# Patient Record
Sex: Female | Born: 1990 | Race: White | Hispanic: No | Marital: Married | Smoking: Never smoker
Health system: Southern US, Community
[De-identification: ages and names within clinical notes are randomized; demographics above are authoritative.]

## PROBLEM LIST (undated history)

## (undated) ENCOUNTER — Inpatient Hospital Stay (HOSPITAL_COMMUNITY): Payer: Self-pay

## (undated) DIAGNOSIS — R87629 Unspecified abnormal cytological findings in specimens from vagina: Secondary | ICD-10-CM

## (undated) DIAGNOSIS — K219 Gastro-esophageal reflux disease without esophagitis: Secondary | ICD-10-CM

## (undated) DIAGNOSIS — R509 Fever, unspecified: Principal | ICD-10-CM

## (undated) DIAGNOSIS — A483 Toxic shock syndrome: Secondary | ICD-10-CM

## (undated) DIAGNOSIS — F419 Anxiety disorder, unspecified: Secondary | ICD-10-CM

## (undated) DIAGNOSIS — K589 Irritable bowel syndrome without diarrhea: Secondary | ICD-10-CM

## (undated) HISTORY — DX: Anxiety disorder, unspecified: F41.9

## (undated) HISTORY — PX: NO PAST SURGERIES: SHX2092

## (undated) HISTORY — DX: Fever, unspecified: R50.9

## (undated) HISTORY — DX: Irritable bowel syndrome without diarrhea: K58.9

## (undated) HISTORY — DX: Unspecified abnormal cytological findings in specimens from vagina: R87.629

## (undated) HISTORY — PX: WISDOM TOOTH EXTRACTION: SHX21

## (undated) HISTORY — DX: Toxic shock syndrome: A48.3

---

## 2013-02-08 ENCOUNTER — Inpatient Hospital Stay (HOSPITAL_COMMUNITY)
Admission: EM | Admit: 2013-02-08 | Discharge: 2013-02-10 | DRG: 423 | Disposition: A | Discharge status: Home / Self Care | Payer: BC Managed Care – PPO | Attending: Internal Medicine | Admitting: Internal Medicine

## 2013-02-08 ENCOUNTER — Emergency Department (HOSPITAL_COMMUNITY): Payer: BC Managed Care – PPO

## 2013-02-08 ENCOUNTER — Encounter (HOSPITAL_COMMUNITY): Payer: Self-pay | Admitting: *Deleted

## 2013-02-08 DIAGNOSIS — A483 Toxic shock syndrome: Principal | ICD-10-CM

## 2013-02-08 DIAGNOSIS — R000 Tachycardia, unspecified: Secondary | ICD-10-CM | POA: Diagnosis present

## 2013-02-08 DIAGNOSIS — Z6828 Body mass index (BMI) 28.0-28.9, adult: Secondary | ICD-10-CM

## 2013-02-08 DIAGNOSIS — IMO0002 Reserved for concepts with insufficient information to code with codable children: Secondary | ICD-10-CM | POA: Diagnosis present

## 2013-02-08 DIAGNOSIS — A419 Sepsis, unspecified organism: Secondary | ICD-10-CM

## 2013-02-08 DIAGNOSIS — T192XXA Foreign body in vulva and vagina, initial encounter: Secondary | ICD-10-CM | POA: Diagnosis present

## 2013-02-08 DIAGNOSIS — R6510 Systemic inflammatory response syndrome (SIRS) of non-infectious origin without acute organ dysfunction: Secondary | ICD-10-CM | POA: Insufficient documentation

## 2013-02-08 DIAGNOSIS — E669 Obesity, unspecified: Secondary | ICD-10-CM | POA: Diagnosis present

## 2013-02-08 LAB — URINALYSIS, ROUTINE W REFLEX MICROSCOPIC
Bilirubin Urine: NEGATIVE
Ketones, ur: NEGATIVE mg/dL
Nitrite: NEGATIVE
Protein, ur: NEGATIVE mg/dL
Urobilinogen, UA: 0.2 mg/dL (ref 0.0–1.0)

## 2013-02-08 LAB — COMPREHENSIVE METABOLIC PANEL
ALT: 13 U/L (ref 0–35)
AST: 15 U/L (ref 0–37)
Albumin: 4 g/dL (ref 3.5–5.2)
Alkaline Phosphatase: 105 U/L (ref 39–117)
BUN: 8 mg/dL (ref 6–23)
CO2: 28 mEq/L (ref 19–32)
Calcium: 9.5 mg/dL (ref 8.4–10.5)
Chloride: 100 mEq/L (ref 96–112)
Creatinine, Ser: 0.73 mg/dL (ref 0.50–1.10)
GFR calc Af Amer: >90 mL/min (ref >90)
GFR calc non Af Amer: >90 mL/min (ref >90)
Glucose, Bld: 88 mg/dL (ref 70–99)
Potassium: 3.7 mEq/L (ref 3.5–5.1)
Sodium: 137 mEq/L (ref 135–145)
Total Bilirubin: 0.5 mg/dL (ref 0.3–1.2)
Total Protein: 7.6 g/dL (ref 6.0–8.3)

## 2013-02-08 LAB — CBC WITH DIFFERENTIAL/PLATELET
Basophils Absolute: 0 10*3/uL (ref 0.0–0.1)
Basophils Relative: 0 % (ref 0–1)
Eosinophils Absolute: 0 10*3/uL (ref 0.0–0.7)
Eosinophils Relative: 0 % (ref 0–5)
HCT: 39.9 % (ref 36.0–46.0)
Hemoglobin: 13.6 g/dL (ref 12.0–15.0)
Lymphocytes Relative: 14 % (ref 12–46)
Lymphs Abs: 1.5 10*3/uL (ref 0.7–4.0)
MCH: 29 pg (ref 26.0–34.0)
MCHC: 34.1 g/dL (ref 30.0–36.0)
MCV: 85.1 fL (ref 78.0–100.0)
Monocytes Absolute: 0.9 10*3/uL (ref 0.1–1.0)
Monocytes Relative: 8 % (ref 3–12)
Neutro Abs: 8.4 10*3/uL — ABNORMAL HIGH (ref 1.7–7.7)
Neutrophils Relative %: 77 % (ref 43–77)
Platelets: 214 10*3/uL (ref 150–400)
RBC: 4.69 MIL/uL (ref 3.87–5.11)
RDW: 12.5 % (ref 11.5–15.5)
WBC: 10.8 10*3/uL — ABNORMAL HIGH (ref 4.0–10.5)

## 2013-02-08 LAB — APTT: aPTT: 31 seconds (ref 24–37)

## 2013-02-08 LAB — PROTIME-INR
INR: 1.1 (ref 0.00–1.49)
Prothrombin Time: 14.1 seconds (ref 11.6–15.2)

## 2013-02-08 MED ORDER — ONDANSETRON HCL 4 MG/2ML IJ SOLN
4.0000 mg | INTRAMUSCULAR | Status: DC | PRN
  Administered 2013-02-09 – 2013-02-10 (×2): 4 mg via INTRAVENOUS
  Filled 2013-02-08 (×2): qty 2

## 2013-02-08 MED ORDER — SODIUM CHLORIDE 0.9 % IV BOLUS (SEPSIS)
1000.0000 mL | Freq: Once | INTRAVENOUS | Status: AC
  Administered 2013-02-08: 1000 mL via INTRAVENOUS

## 2013-02-08 MED ORDER — ALUM & MAG HYDROXIDE-SIMETH 200-200-20 MG/5ML PO SUSP: 30.0000 mL | Freq: Four times a day (QID) | ORAL | Status: DC | PRN

## 2013-02-08 MED ORDER — ACETAMINOPHEN 325 MG PO TABS
650.0000 mg | ORAL_TABLET | ORAL | Status: DC | PRN
  Administered 2013-02-09: 650 mg via ORAL
  Filled 2013-02-08: qty 2

## 2013-02-08 MED ORDER — KETOROLAC TROMETHAMINE 30 MG/ML IJ SOLN
30.0000 mg | Freq: Once | INTRAMUSCULAR | Status: AC
  Administered 2013-02-08: 30 mg via INTRAVENOUS
  Filled 2013-02-08: qty 1

## 2013-02-08 MED ORDER — RISAQUAD PO CAPS
2.0000 | ORAL_CAPSULE | Freq: Every day | ORAL | Status: DC
  Administered 2013-02-09 – 2013-02-10 (×2): 2 via ORAL
  Filled 2013-02-08: qty 1
  Filled 2013-02-08 (×2): qty 2

## 2013-02-08 MED ORDER — ENOXAPARIN SODIUM 40 MG/0.4ML ~~LOC~~ SOLN
40.0000 mg | SUBCUTANEOUS | Status: DC
  Administered 2013-02-09: 40 mg via SUBCUTANEOUS
  Filled 2013-02-08: qty 0.4

## 2013-02-08 MED ORDER — HYDROMORPHONE HCL PF 1 MG/ML IJ SOLN: 0.5000 mg | INTRAMUSCULAR | Status: DC | PRN

## 2013-02-08 MED ORDER — SODIUM CHLORIDE 0.9 % IJ SOLN: 3.0000 mL | Freq: Two times a day (BID) | INTRAMUSCULAR | Status: DC

## 2013-02-08 MED ORDER — OXYCODONE HCL 5 MG PO TABS: 5.0000 mg | ORAL_TABLET | ORAL | Status: DC | PRN

## 2013-02-08 MED ORDER — VANCOMYCIN HCL IN DEXTROSE 1-5 GM/200ML-% IV SOLN
1000.0000 mg | Freq: Once | INTRAVENOUS | Status: AC
  Administered 2013-02-08: 1000 mg via INTRAVENOUS
  Filled 2013-02-08: qty 200

## 2013-02-08 MED ORDER — CLINDAMYCIN PHOSPHATE 600 MG/50ML IV SOLN
600.0000 mg | Freq: Once | INTRAVENOUS | Status: AC
  Administered 2013-02-09: 600 mg via INTRAVENOUS

## 2013-02-08 MED ORDER — HYDROXYZINE HCL 25 MG PO TABS
25.0000 mg | ORAL_TABLET | Freq: Every day | ORAL | Status: DC | PRN
Start: 2013-02-08 — End: 2013-02-10

## 2013-02-08 MED ORDER — BISACODYL 5 MG PO TBEC: 5.0000 mg | DELAYED_RELEASE_TABLET | Freq: Every day | ORAL | Status: DC | PRN

## 2013-02-08 MED ORDER — POTASSIUM CHLORIDE IN NACL 20-0.9 MEQ/L-% IV SOLN
INTRAVENOUS | Status: DC
  Administered 2013-02-09 – 2013-02-10 (×4): via INTRAVENOUS

## 2013-02-08 MED ORDER — ACETAMINOPHEN 325 MG PO TABS
650.0000 mg | ORAL_TABLET | Freq: Once | ORAL | Status: AC
  Administered 2013-02-08: 650 mg via ORAL
  Filled 2013-02-08: qty 2

## 2013-02-08 MED ORDER — TRAZODONE HCL 50 MG PO TABS: 25.0000 mg | ORAL_TABLET | Freq: Every evening | ORAL | Status: DC | PRN

## 2013-02-08 MED ORDER — CLINDAMYCIN PHOSPHATE 600 MG/50ML IV SOLN
INTRAVENOUS | Status: AC
  Filled 2013-02-08: qty 100

## 2013-02-08 MED ORDER — DIPHENHYDRAMINE HCL 50 MG/ML IJ SOLN
INTRAMUSCULAR | Status: AC
  Filled 2013-02-08: qty 1

## 2013-02-08 MED ORDER — CLINDAMYCIN PHOSPHATE 600 MG/50ML IV SOLN
600.0000 mg | Freq: Three times a day (TID) | INTRAVENOUS | Status: DC
  Administered 2013-02-09 – 2013-02-10 (×4): 600 mg via INTRAVENOUS
  Filled 2013-02-08 (×8): qty 50

## 2013-02-08 MED ORDER — DIPHENHYDRAMINE HCL 50 MG/ML IJ SOLN
25.0000 mg | Freq: Once | INTRAMUSCULAR | Status: AC
  Administered 2013-02-08: 25 mg via INTRAVENOUS

## 2013-02-08 NOTE — ED Provider Notes (Signed)
History     CSN: [id number]  Arrival date & time 02/08/13  1327   First MD Initiated Contact with Patient 02/08/13 1357      Chief Complaint  Patient presents with  . Fever    (Consider location/radiation/quality/duration/timing/severity/associated sxs/prior treatment) Patient is a 22 y.o. female presenting with fever. The history is provided by the patient. No language interpreter was used.  Fever Temp source:  Subjective Severity:  Moderate Onset quality:  Sudden Timing:  Constant Progression:  Waxing and waning Chronicity:  New Relieved by:  Nothing Ineffective treatments:  None tried Associated symptoms: diarrhea   Associated symptoms: no chest pain, no confusion, no congestion, no cough, no headaches and no rash     History reviewed. No pertinent past medical history.  History reviewed. No pertinent past surgical history.  History reviewed. No pertinent family history.  History  Substance Use Topics  . Smoking status: Never Smoker   . Smokeless tobacco: Not on file  . Alcohol Use: No    OB History   Grav Para Term Preterm Abortions TAB SAB Ect Mult Living                  Review of Systems  Constitutional: Positive for fever. Negative for appetite change and fatigue.  HENT: Negative for congestion, sinus pressure and ear discharge.   Eyes: Negative for discharge.  Respiratory: Negative for cough.   Cardiovascular: Negative for chest pain.  Gastrointestinal: Positive for diarrhea. Negative for abdominal pain.  Genitourinary: Negative for frequency and hematuria.  Musculoskeletal: Negative for back pain.  Skin: Negative for rash.  Neurological: Negative for seizures and headaches.  Psychiatric/Behavioral: Negative for hallucinations and confusion.    Allergies  Bactrim; Penicillins; and Zithromax  Home Medications   Current Outpatient Rx  Name  Route  Sig  Dispense  Refill  . hydrOXYzine (ATARAX/VISTARIL) 25 MG tablet   Oral   Take 25 mg by  mouth daily as needed (for panic attacks).         . Multiple Vitamins-Minerals (MULTIVITAMIN GUMMIES ADULT PO)   Oral   Take 1-2 each by mouth daily.           BP 120/79  Pulse 110  Temp(Src) 100.3 F (37.9 C) (Oral)  Resp 18  Ht 5\' 7"  (1.702 m)  Wt 167 lb (75.751 kg)  BMI 26.15 kg/m2  SpO2 100%  LMP 02/04/2013  Physical Exam  Constitutional: She is oriented to person, place, and time. She appears well-developed.  HENT:  Head: Normocephalic.  Eyes: Conjunctivae and EOM are normal. No scleral icterus.  Neck: Neck supple. No thyromegaly present.  Cardiovascular: Normal rate.  Exam reveals no gallop and no friction rub.   No murmur heard. tachycardia  Pulmonary/Chest: No stridor. She has no wheezes. She has no rales. She exhibits no tenderness.  Abdominal: She exhibits no distension. There is no tenderness. There is no rebound.  Genitourinary:  Nl pelvic  Musculoskeletal: Normal range of motion. She exhibits no edema.  Lymphadenopathy:    She has no cervical adenopathy.  Neurological: She is oriented to person, place, and time. Coordination normal.  Skin: No rash noted. No erythema.  Psychiatric: She has a normal mood and affect. Her behavior is normal.    ED Course  Procedures (including critical care time)  Labs Reviewed  URINALYSIS, ROUTINE W REFLEX MICROSCOPIC - Abnormal; Notable for the following:    Hgb urine dipstick SMALL (*)    All other components within normal limits  CBC WITH DIFFERENTIAL - Abnormal; Notable for the following:    WBC 10.8 (*)    Neutro Abs 8.4 (*)    All other components within normal limits  CULTURE, BLOOD (ROUTINE X 2)  CULTURE, BLOOD (ROUTINE X 2)  GC/CHLAMYDIA PROBE AMP  ANAEROBIC CULTURE  URINE MICROSCOPIC-ADD ON  COMPREHENSIVE METABOLIC PANEL  PROTIME-INR  APTT  POCT PREGNANCY, URINE   Dg Abd Acute W/chest  02/08/2013  *RADIOLOGY REPORT*  Clinical Data: Possible toxic shock syndrome  ACUTE ABDOMEN SERIES (ABDOMEN 2 VIEW  & CHEST 1 VIEW)  Comparison: None.  Findings:  Normal cardiac silhouette and mediastinal contours.  Lung volumes are slightly reduced with minimal perihilar linear opacities favored to represent atelectasis.  No focal airspace opacity.  No pleural effusion or pneumothorax.  Paucity of bowel gas without evidence of obstruction.  No pneumoperitoneum, pneumatosis or portal venous gas.  No abnormal intra-abdominal calcifications.  No acute osseous abnormalities.  IMPRESSION: 1.  No acute cardiopulmonary disease. 2.  Nonobstructive bowel gas pattern.   Original Report Authenticated By: Tacey Ruiz, MD      1. Toxic shock syndrome       MDM          Benny Lennert, MD 02/08/13 [phone]

## 2013-02-08 NOTE — ED Notes (Signed)
Fever 102, chills, headache, low back pain,  Sent by Dr Henreitta Leber in  [place]. Pt had left a tampon in for 16 hours yesterday. Nausea, diarrhea

## 2013-02-08 NOTE — ED Notes (Signed)
Dr Zammit at bedside. 

## 2013-02-08 NOTE — H&P (Signed)
[facility] History and Physical  Vanessa Stone  XBJ:[id number]  DOB: 1991-08-29   DOA: 02/08/2013   PCP:   Vanessa Stone, D.O.  Chief Complaint:  Fever and generalized aches today  HPI: Vanessa Stone is an 22 y.o. female.   Young Caucasian lady with no significant chronic medical problemS, will ice she left her tampon in for 16 hours and removed it at about 4 AM this morning. By 6 AM she started having watery yellow diarrhea fever and generalized body aches, and went to her primary care physician for evaluation. He found a white count of the elevated and recommended she go to the emergency room because of possible toxic shock syndrome.  She has been markedly tachycardic but her blood pressure has been well maintained. She has had about 18 watery yellow-green stool no blood. She denies  vomiting  In the emergency room patient was evaluated she was alert and oriented to normotensive, and the hospitalist service was requested for admission for intravenous antibiotics. ER physician reports she has no abnormal  vagina with discharge and no pelvic tenderness  Rewiew of Systems:   All systems negative except as marked bold or noted in the HPI;  Constitutional:    malaise, fever and chills. ; Generalized body aches Eyes:   eye pain, redness and discharge. ;  ENMT:   ear pain, hoarseness, nasal congestion, sinus pressure and sore throat. ;  Cardiovascular:    chest pain, palpitations, diaphoresis, dyspnea and peripheral edema.  Respiratory:   cough, hemoptysis, wheezing and stridor. ;  Gastrointestinal:  nausea, vomiting, diarrhea, constipation, abdominal pain, melena, blood in stool, hematemesis, jaundice and rectal bleeding. unusual weight loss..   Genitourinary:    frequency, dysuria, incontinence,flank pain and hematuria; Musculoskeletal:   back pain and neck pain.  swelling and trauma.;  Skin: .  pruritus, rash, abrasions, bruising and skin lesion.; ulcerations Neuro:     headache, lightheadedness and neck stiffness.  weakness, altered level of consciousness, altered mental status, extremity weakness, burning feet, involuntary movement, seizure and syncope.  Psych:    anxiety, depression, insomnia, tearfulness, panic attacks, hallucinations, paranoia, suicidal or homicidal ideation   Past Medical History  Diagnosis Date  . Medical history non-contributory     Past Surgical History  Procedure Laterality Date  . No past surgeries      Medications:  HOME MEDS: Prior to Admission medications   Medication Sig Start Date End Date Taking? Authorizing Provider  hydrOXYzine (ATARAX/VISTARIL) 25 MG tablet Take 25 mg by mouth daily as needed (for panic attacks).   Yes Historical Provider, MD  Multiple Vitamins-Minerals (MULTIVITAMIN GUMMIES ADULT PO) Take 1-2 each by mouth daily.   Yes Historical Provider, MD     Allergies:  Allergies  Allergen Reactions  . Bactrim (Sulfamethoxazole W-Trimethoprim) Diarrhea and Nausea And Vomiting  . Penicillins Hives and Swelling  . Vancomycin Other (See Comments)    Red man syndrome  . Zithromax (Azithromycin) Diarrhea and Nausea And Vomiting    Social History:   reports that she has never smoked. She does not have any smokeless tobacco history on file. She reports that she does not drink alcohol or use illicit drugs.  Family History: History reviewed. No pertinent family history. Her father had lung and colon cancer Her mother was diabetic had hypERthyroidism and became hypothyroid of the radioiodine ablation  Physical Exam: Filed Vitals:   02/08/13 1838 02/08/13 2001 02/08/13 2053 02/08/13 2148  BP: 120/79 122/74 118/74 116/70  Pulse: 110 113  112  Temp: 100.3 F (37.9 C) 99 F (37.2 C)  98.9 F (37.2 C)  TempSrc: Oral   Oral  Resp: 18 20  20   Height:    5\' 6"  (1.676 m)  Weight:    76.5 kg (168 lb 10.4 oz)  SpO2: 100% 96%  99%   Blood pressure 116/70, pulse 112, temperature 98.9 F (37.2 C),  temperature source Oral, resp. rate 20, height 5\' 6"  (1.676 m), weight 76.5 kg (168 lb 10.4 oz), last menstrual period 02/04/2013, SpO2 99.00%.  GEN:  Pleasant obese young Caucasian lady lying bed in no acute distress; cooperative with exam PSYCH:  alert and oriented x4;  neither anxious or depressed; affect is appropriate. HEENT: Mucous membranes pink dry and anicteric; PERRLA; EOM intact; no cervical lymphadenopathy nor thyromegaly or carotid bruit; no JVD; Breasts:: Not examined CHEST WALL: No tenderness CHEST: Normal respiration, clear to auscultation bilaterally HEART: Regular rate and rhythm; no murmurs rubs or gallops BACK: No kyphosis no scoliosis; no CVA tenderness ABDOMEN: Obese, soft non-tender; no masses, no organomegaly, normal abdominal bowel sounds; no pannus; no intertriginous candida. Rectal Exam: Not done EXTREMITIES: No bone or joint deformity;; no edema; no ulcerations. Genitalia: not examined PULSES: 2+ and symmetric SKIN: Normal hydration no rash or ulceration CNS: Cranial nerves 2-12 grossly intact no focal lateralizing neurologic deficit   Labs on Admission:  Basic Metabolic Panel:  Recent Labs Lab 02/08/13 1628  NA 137  K 3.7  CL 100  CO2 28  GLUCOSE 88  BUN 8  CREATININE 0.73  CALCIUM 9.5   Liver Function Tests:  Recent Labs Lab 02/08/13 1628  AST 15  ALT 13  ALKPHOS 105  BILITOT 0.5  PROT 7.6  ALBUMIN 4.0   No results found for this basename: LIPASE, AMYLASE,  in the last 168 hours No results found for this basename: AMMONIA,  in the last 168 hours CBC:  Recent Labs Lab 02/08/13 1628  WBC 10.8*  NEUTROABS 8.4*  HGB 13.6  HCT 39.9  MCV 85.1  PLT 214   Cardiac Enzymes: No results found for this basename: CKTOTAL, CKMB, CKMBINDEX, TROPONINI,  in the last 168 hours BNP: No components found with this basename: POCBNP,  D-dimer: No components found with this basename: D-DIMER,  CBG: No results found for this basename: GLUCAP,  in  the last 168 hours  Radiological Exams on Admission: Dg Abd Acute W/chest  02/08/2013  *RADIOLOGY REPORT*  Clinical Data: Possible toxic shock syndrome  ACUTE ABDOMEN SERIES (ABDOMEN 2 VIEW & CHEST 1 VIEW)  Comparison: None.  Findings:  Normal cardiac silhouette and mediastinal contours.  Lung volumes are slightly reduced with minimal perihilar linear opacities favored to represent atelectasis.  No focal airspace opacity.  No pleural effusion or pneumothorax.  Paucity of bowel gas without evidence of obstruction.  No pneumoperitoneum, pneumatosis or portal venous gas.  No abnormal intra-abdominal calcifications.  No acute osseous abnormalities.  IMPRESSION: 1.  No acute cardiopulmonary disease. 2.  Nonobstructive bowel gas pattern.   Original Report Authenticated By: Tacey Ruiz, MD      Assessment/Plan Present on Admission:  . SIRS (systemic inflammatory response syndrome) Possible early toxic shock syndrome  Were bring this lady in on observation for vigorous IV fluid hydration and intravenous clindamycin; she had a reaction to intravenous vancomycin. We'll give probiotics and clindamycin   CODE STATUS: Full code Family communication: Plans discussed with patient and her fianc at bedside Disposition: Observation admit to likely home tomorrow if STABLE  Deazia Lampi Nocturnist [facility] Pager [phone]   02/08/2013, 11:22 PM

## 2013-02-08 NOTE — ED Notes (Signed)
Pts face is still red and states her head feels a little tight. Pt given benadryl for reaction.

## 2013-02-08 NOTE — ED Notes (Signed)
Dr. Orvan Falconer advised of pts reaction.

## 2013-02-08 NOTE — ED Notes (Signed)
Pt states that he period has completely stopped today, and it is supposed to end tomorrow.

## 2013-02-08 NOTE — ED Notes (Signed)
Pt reports having green diarrhea and has started itching since vancomycin was started.

## 2013-02-09 DIAGNOSIS — A419 Sepsis, unspecified organism: Secondary | ICD-10-CM

## 2013-02-09 DIAGNOSIS — A483 Toxic shock syndrome: Secondary | ICD-10-CM

## 2013-02-09 LAB — CBC
HCT: 37.7 % (ref 36.0–46.0)
Hemoglobin: 12.6 g/dL (ref 12.0–15.0)
MCH: 29 pg (ref 26.0–34.0)
MCHC: 33.4 g/dL (ref 30.0–36.0)
MCV: 86.9 fL (ref 78.0–100.0)

## 2013-02-09 LAB — COMPREHENSIVE METABOLIC PANEL
BUN: 7 mg/dL (ref 6–23)
Calcium: 8.7 mg/dL (ref 8.4–10.5)
GFR calc Af Amer: >90 mL/min (ref >90)
Glucose, Bld: 103 mg/dL — ABNORMAL HIGH (ref 70–99)
Total Protein: 6.7 g/dL (ref 6.0–8.3)

## 2013-02-09 MED ORDER — LOPERAMIDE HCL 2 MG PO CAPS: 2.0000 mg | ORAL_CAPSULE | ORAL | Status: DC | PRN

## 2013-02-09 MED ORDER — LOPERAMIDE HCL 2 MG PO CAPS
4.0000 mg | ORAL_CAPSULE | Freq: Once | ORAL | Status: AC
  Administered 2013-02-09: 4 mg via ORAL
  Filled 2013-02-09: qty 2

## 2013-02-09 NOTE — Progress Notes (Signed)
UR Chart Review Completed  

## 2013-02-09 NOTE — Progress Notes (Signed)
[facility] PROGRESS NOTE  Nykeria Mealing ZOX:[id number] DOB: 09-03-1991 DOA: 02/08/2013 PCP: Anson [place], DO  Assessment/Plan: 1. Suspected toxic shock syndrome, early sepsis: Monitor fever curve. Continue clindamycin. Had "red man syndrome" with vancomycin. Follow blood cultures. Given clinical stability we will hold off on further vancomycin. Given normal pelvic exam description by EDP and full removal of tampon there is no reason to suspect retained contents. Monitor clinically  Code Status: Full code DVT prophylaxis: Will discontinue Lovenox. Early ambulation. Family Communication: Discussed with mother at bedside Disposition Plan: Anticipate discharge 4/7 home  Brendia Sacks, MD  [facility]  Pager [phone] If 7PM-7AM, please contact night-coverage at [email or web address], password [facility] 02/09/2013, 9:55 AM  LOS: 1 day   Brief narrative: 22 year old woman admitted for toxic shock syndrome.  Consultants:  None  Procedures:    Antibiotics:  Clindamycin 5/6  HPI/Subjective: Afebrile, vitals stable. Afebrile this a.m. 102.5. Overall she feels much better. No nausea or vomiting. Eating breakfast. No abdominal pain. Minimal vaginal discharge. EDP reported normal pelvic exam. Patient reports tampon came out completely.  Objective: Filed Vitals:   02/08/13 2001 02/08/13 2053 02/08/13 2148 02/09/13 0630  BP: 122/74 118/74 116/70 128/88  Pulse: 113  112 126  Temp: 99 F (37.2 C)  98.9 F (37.2 C) 102.5 F (39.2 C)  TempSrc:   Oral Oral  Resp: 20  20 20   Height:   5\' 6"  (1.676 m)   Weight:   76.5 kg (168 lb 10.4 oz) 78 kg (171 lb 15.3 oz)  SpO2: 96%  99% 98%    Intake/Output Summary (Last 24 hours) at 02/09/13 0955 Last data filed at 02/09/13 0600  Gross per 24 hour  Intake   3100 ml  Output    300 ml  Net   2800 ml   Filed Weights   02/08/13 1351 02/08/13 2148 02/09/13 0630  Weight: 75.751 kg (167 lb) 76.5 kg (168 lb 10.4 oz) 78 kg (171 lb 15.3 oz)     Exam:  General:  Appears calm and comfortable. Very well-appearing. Eating breakfast. Cardiovascular: RRR, no m/r/g. No LE edema. Respiratory: CTA bilaterally, no w/r/r. Normal respiratory effort. Abdomen: soft, ntnd Skin: no rash or induration seen Psychiatric: grossly normal mood and affect, speech fluent and appropriate  Data Reviewed: Basic Metabolic Panel:  Recent Labs Lab 02/08/13 1628 02/09/13 0504  NA 137 139  K 3.7 3.8  CL 100 105  CO2 28 26  GLUCOSE 88 103*  BUN 8 7  CREATININE 0.73 0.72  CALCIUM 9.5 8.7   Liver Function Tests:  Recent Labs Lab 02/08/13 1628 02/09/13 0504  AST 15 14  ALT 13 11  ALKPHOS 105 98  BILITOT 0.5 0.4  PROT 7.6 6.7  ALBUMIN 4.0 3.4*   CBC:  Recent Labs Lab 02/08/13 1628 02/09/13 0504  WBC 10.8* 9.1  NEUTROABS 8.4*  --   HGB 13.6 12.6  HCT 39.9 37.7  MCV 85.1 86.9  PLT 214 193     Studies: Dg Abd Acute W/chest  02/08/2013  *RADIOLOGY REPORT*  Clinical Data: Possible toxic shock syndrome  ACUTE ABDOMEN SERIES (ABDOMEN 2 VIEW & CHEST 1 VIEW)  Comparison: None.  Findings:  Normal cardiac silhouette and mediastinal contours.  Lung volumes are slightly reduced with minimal perihilar linear opacities favored to represent atelectasis.  No focal airspace opacity.  No pleural effusion or pneumothorax.  Paucity of bowel gas without evidence of obstruction.  No pneumoperitoneum, pneumatosis or portal venous gas.  No abnormal intra-abdominal  calcifications.  No acute osseous abnormalities.  IMPRESSION: 1.  No acute cardiopulmonary disease. 2.  Nonobstructive bowel gas pattern.   Original Report Authenticated By: Tacey Ruiz, MD     Scheduled Meds: . acidophilus  2 capsule Oral Daily  . clindamycin (CLEOCIN) IV  600 mg Intravenous Q8H  . enoxaparin (LOVENOX) injection  40 mg Subcutaneous Q24H  . sodium chloride  3 mL Intravenous Q12H   Continuous Infusions: . 0.9 % NaCl with KCl 20 mEq / L 150 mL/hr at 02/09/13 8756     Principal Problem:   TSS (toxic shock syndrome) Active Problems:   Sepsis     Brendia Sacks, MD  [facility] Pager [phone] If 7PM-7AM, please contact night-coverage at [email or web address], password [facility] 02/09/2013, 9:55 AM  LOS: 1 day   Time spent: 15 minutes

## 2013-02-09 NOTE — Progress Notes (Signed)
Pt c/o diarrhea today and states that she feels like she may be getting a yeast infection.  Pt's heart rate has been between 100-120 today.  Dr. Irene Limbo on unit and notified.  No new orders at this time.

## 2013-02-09 NOTE — Plan of Care (Signed)
Problem: Phase II Progression Outcomes Goal: Progress activity as tolerated unless otherwise ordered Outcome: Completed/Met Date Met:  02/09/13 Activity as tolerated.

## 2013-02-09 NOTE — Care Management Note (Unsigned)
    Page 1 of 1   02/09/2013     3:28:51 PM   CARE MANAGEMENT NOTE 02/09/2013  Patient:  Vanessa Stone, Vanessa Stone   Account Number:  [id number]  Date Initiated:  02/09/2013  Documentation initiated by:  Anibal Henderson  Subjective/Objective Assessment:   Admitted with possible sepsis, toxic shock syndrome. Patient is from home, is independent and will return home at discharge.     Action/Plan:   No CM needs identified   Anticipated DC Date:  02/11/2013   Anticipated DC Plan:  HOME/SELF CARE      DC Planning Services  CM consult      Choice offered to / List presented to:             Status of service:  In process, will continue to follow Medicare Important Message given?   (If response is "NO", the following Medicare IM given date fields will be blank) Date Medicare IM given:   Date Additional Medicare IM given:    Discharge Disposition:    Per UR Regulation:  Reviewed for med. necessity/level of care/duration of stay  If discussed at Long Length of Stay Meetings, dates discussed:    Comments:  02/09/13 1500 Anibal Henderson RN/CM

## 2013-02-09 NOTE — Plan of Care (Signed)
Problem: Phase I Progression Outcomes Goal: Initial discharge plan identified Outcome: Progressing Anticipated discharge 02/10/2013.

## 2013-02-09 NOTE — Plan of Care (Signed)
Problem: Phase II Progression Outcomes Goal: Discharge plan established Outcome: Progressing Anticipated discharge 02/10/2013.

## 2013-02-10 LAB — TROPONIN I: Troponin I: <0.3 ng/mL (ref <0.30)

## 2013-02-10 LAB — GC/CHLAMYDIA PROBE AMP: CT Probe RNA: NEGATIVE

## 2013-02-10 LAB — CK TOTAL AND CKMB (NOT AT ARMC)
Relative Index: INVALID (ref 0.0–2.5)
Total CK: 53 U/L (ref 7–177)

## 2013-02-10 MED ORDER — FLUCONAZOLE 150 MG PO TABS: 150.0000 mg | ORAL_TABLET | Freq: Once | ORAL | Status: DC

## 2013-02-10 MED ORDER — CLINDAMYCIN HCL 300 MG PO CAPS: 300.0000 mg | ORAL_CAPSULE | Freq: Three times a day (TID) | ORAL | Status: DC

## 2013-02-10 NOTE — Progress Notes (Signed)
Patient c/o chest pressure. EKG obtained- Normal Sinus Rhythm. No changes noted per [employer]. Vital signs WNL. MD. Paged and new orders given.

## 2013-02-10 NOTE — Discharge Instructions (Signed)
Toxic Shock Syndrome Toxic shock syndrome (TSS) is a disease that is caused by substances called toxins. The toxins that cause TSS may be made by three different types of bacteria. The toxins get into the bloodstream and damage many vital organs. Fever, rash, swelling of the hands and feet, shock, heart failure, kidney failure, liver failure, and lung damage have all been associated with TSS. TSS can also cause blood clotting and bleeding problems. TSS can be life-threatening. CAUSES  TSS occurs when one of three bacteria (Staphylococcus aureus, Streptococcus pyogenes, or Clostridium sordelli) enter the body through breaks or cracks in the skin or other tissue. The bacteria then produces toxins which enter the bloodstream and cause damage to many organs of the body. Staphylococcal TSS is caused by the bacteria Staphylococcus aureus. It can occur in menstruating women. Risk for this disease increases in women who use:  High absorbency tampons.  Diaphragms.  Sponges.  Cervical caps. Tampons can cause scrapes on the vaginal wall. This damage allows bacteria to enter into the vaginal wall. Super absorbent tampons are more harmful because they expand so much that they actually stick to the vaginal wall. When the tampon is removed, a layer of the vaginal lining may be scraped or peeled off. About half of all episodes of staphylococcal TSS occur in men, children, and menopausal women. In these cases, skin wounds or recent surgical cuts (incisions) are the site of entry for the bacteria. Streptococcal TSS is caused by the bacteria Streptococcus pyogenes. It may occur in healthy or ill children and adults of any age. The entry site of the bacteria into the body may not show obvious signs of infection. This disease can follow minor injuries such as blood clots under the skin, bruises, and muscle strain. It may also occur following chickenpox or a surgical procedure. People who have diabetes or alcoholism seem to  be at increased risk for this disease. Streptococcal TSS may also occur suddenly following childbirth. Clostridial TSS is caused by the bacteria Clostridium sordelli. This bacteria is naturally present in the vagina. The bacteria may enter a woman's tissue and cause this disease following childbirth, abortion, or a procedure or surgery on a woman's sexual organs. SYMPTOMS  Signs of an infection (warmth, redness, swelling, pain, or fluid drainage) where the bacteria entered the body may or may not be found. Symptoms of TSS may include:  High fever.  Vomiting.  Diarrhea.  Sunburn-like rash.  Low blood pressure.  Muscle aches and pains.  Headaches.  Sore throat.  Bloodshot eyes.  Confusion.  Swelling or peeling of the skin on the palms and soles of the feet.  Increase in heart rate. DIAGNOSIS  Your caregiver may perform some of the following tests to determine whether you have TSS:  Blood tests.  Blood, wound, and skin sample cultures.  Cultures from the vagina, mucus (sputum), and throat.  Chest X-rays.  Analysis of urine (urinalysis).  Lumbar puncture to check the spinal fluid. TREATMENT  Hospitalization is usually required. There is no way to predict which individuals with early TSS will develop severe medical problems. People who get proper treatment usually get better within 2 to 3 weeks. Treatment may include:  Immediate removal of the tampon, diaphragm, sponge, or cervical cap, if this applies.  Antibiotic medicines. Antibiotics may be given through an intravenous (IV) access.  IV fluids and medicines to raise low blood pressure and to replace fluids and body salts (sodium, potassium) lost from vomiting or diarrhea.  Dialysis if  there is severe kidney involvement. A kidney transplant may be necessary in rare cases.  Surgery to remove infected tissue. This is rare, except in some cases of streptococcal TSS. PREVENTION  Follow recommended tampon  guidelines.  Avoid super absorbent tampons.  Alternate the use of tampons with sanitary napkins or pads during your period. Use tampons during the day and pads at night.  Never leave a tampon inserted overnight.  Change tampons often, at least every 4 to 6 hours.  Do not leave in diaphragms, sponges, or cervical caps longer than instructed on the label.  Wash your hands with soap and warm water before inserting and removing a tampon, diaphragm, sponge, or cervical cap.  When inserting a tampon, be extremely careful not to scratch the vaginal lining. Plastic applicators may cause an increased risk because of the sharp edges produced when opening the applicator. If your vagina seems dry, use a water-soluble lubricating jelly. This will ease insertion.  Do not use a tampon between periods. It may dry out the vagina.  Use 100% cotton tampons instead of those made with synthetic materials.  Always choose the tampon with the least absorbency to meet your needs.  At the first sign of a fever or rash, remove the tampon immediately.  Do not leave your diaphragm or sponge in for longer than 12 hours.  Do not use tampons, a sponge, a cervical cap, or a diaphragm for 12 weeks after delivering a baby.  Do not use gauze packing for a nosebleed.  Wash cuts and scrapes with warm water and soap.  Keep scrapes and cuts covered to prevent infection from developing. HOME CARE INSTRUCTIONS  Take your antibiotics as directed. Finish them even if you start to feel better.  Women who had TSS should not use tampons ever again. SEEK IMMEDIATE MEDICAL CARE IF:   You have chills, vomiting, diarrhea, or a rash.  You have a convulsion or faint.  You have a cut or surgery and the skin around the wound becomes red, swollen, or drains pus.  You have a fever.  You have red streaks on the skin coming from an injury, piercing, or tattoo.  You just had a baby, within 12 weeks, and one of your children  has strep throat. Document Released: 12/14/2002 Document Revised: 12/16/2011 Document Reviewed: 05/23/2011 [facility] Patient Information 2013 [place], [place].

## 2013-02-10 NOTE — Plan of Care (Signed)
Problem: Discharge Progression Outcomes Goal: Other Discharge Outcomes/Goals Outcome: Completed/Met Date Met:  02/10/13 Discharged to home with mother Pt is without pain and nausea VS are stable

## 2013-02-10 NOTE — Progress Notes (Signed)
Pt. C/o nausea and given Zofran IV. No further c/o nausea and pt stated that pressure let up after Zofran was given.

## 2013-02-10 NOTE — Discharge Summary (Signed)
Physician Discharge Summary  Vanessa Stone ZOX:[id number] DOB: 1991/10/05 DOA: 02/08/2013  PCP: Anson Oregon, DO  Admit date: 02/08/2013 Discharge date: 02/10/2013  Time spent: 40 minutes  Recommendations for Outpatient Follow-up:  1. Followup primary care physician in one to 2 weeks  Discharge Diagnoses:  Principal Problem:   TSS (toxic shock syndrome) Active Problems:   Sepsis   Discharge Condition: Improved  Diet recommendation: Regular  Filed Weights   02/08/13 2148 02/09/13 0630 02/10/13 0534  Weight: 76.5 kg (168 lb 10.4 oz) 78 kg (171 lb 15.3 oz) 79.2 kg (174 lb 9.7 oz)    History of present illness:  Vanessa Stone is an 22 y.o. female. Young Caucasian lady with no significant chronic medical problemS, will ice she left her tampon in for 16 hours and removed it at about 4 AM this morning. By 6 AM she started having watery yellow diarrhea fever and generalized body aches, and went to her primary care physician for evaluation. He found a white count of the elevated and recommended she go to the emergency room because of possible toxic shock syndrome.  She has been markedly tachycardic but her blood pressure has been well maintained. She has had about 18 watery yellow-green stool no blood. She denies vomiting  In the emergency room patient was evaluated she was alert and oriented to normotensive, and the hospitalist service was requested for admission for intravenous antibiotics.  ER physician reports she has no abnormal vagina with discharge and no pelvic tenderness   Hospital Course:  This patient was admitted to the hospital with fever, generalized body aches, nausea vomiting diarrhea. She had left her tampon in for 16 hours and removed at 4 AM on the morning of admission. By 6 AM, she had developed watery diarrhea, fevers and generalized body aches. She got her primary physician who had noted an elevated white count and referred her to the emergency room for possible  toxic shock syndrome. She was noted to the hospital started on clindamycin. She reported an allergy to vancomycin. Clinically, she has improved. She is no longer febrile and WBC count has normalized. She's not having any further diarrhea or vomiting and is tolerating by mouth intake. She's not had any further fevers her heart rate has normalized. Antibiotics were changed to by mouth. She's been advised to no longer use tampons. She is otherwise stable for discharge.  Procedures:  None  Consultations:  None  Discharge Exam: Filed Vitals:   02/09/13 2123 02/10/13 0200 02/10/13 0534 02/10/13 1127  BP: 113/77 110/66 128/89 117/68  Pulse: 85 77 92 72  Temp: 98.8 F (37.1 C) 97.9 F (36.6 C) 97.8 F (36.6 C) 98.3 F (36.8 C)  TempSrc: Oral Oral Oral Oral  Resp: 20 20 20 20   Height:      Weight:   79.2 kg (174 lb 9.7 oz)   SpO2: 97% 98% 100% 100%    General: No acute distress Cardiovascular: S1, S2, regular rate and rhythm Respiratory: Clear to auscultation bilaterally  Discharge Instructions  Discharge Orders   Future Orders Complete By Expires     Call MD for:  extreme fatigue  As directed     Call MD for:  persistant dizziness or light-headedness  As directed     Call MD for:  persistant nausea and vomiting  As directed     Call MD for:  temperature >100.4  As directed     Diet general  As directed     Increase activity  slowly  As directed     Other Restrictions  As directed     Comments:      Do not use tampons until cleared by your family doctor        Medication List    TAKE these medications       clindamycin 300 MG capsule  Commonly known as:  CLEOCIN  Take 1 capsule (300 mg total) by mouth 3 (three) times daily.     fluconazole 150 MG tablet  Commonly known as:  DIFLUCAN  Take 1 tablet (150 mg total) by mouth once.     hydrOXYzine 25 MG tablet  Commonly known as:  ATARAX/VISTARIL  Take 25 mg by mouth daily as needed (for panic attacks).      MULTIVITAMIN GUMMIES ADULT PO  Take 1-2 each by mouth daily.       Allergies  Allergen Reactions  . Bactrim (Sulfamethoxazole W-Trimethoprim) Diarrhea and Nausea And Vomiting  . Penicillins Hives and Swelling  . Vancomycin Other (See Comments)    Red man syndrome  . Zithromax (Azithromycin) Diarrhea and Nausea And Vomiting       Follow-up Information   Follow up with Anson Oregon, DO. (1-2 weeks, call for appointment)    Contact information:   [address] [place] [address] [phone]        The results of significant diagnostics from this hospitalization (including imaging, microbiology, ancillary and laboratory) are listed below for reference.    Significant Diagnostic Studies: Dg Abd Acute W/chest  02/08/2013  *RADIOLOGY REPORT*  Clinical Data: Possible toxic shock syndrome  ACUTE ABDOMEN SERIES (ABDOMEN 2 VIEW & CHEST 1 VIEW)  Comparison: None.  Findings:  Normal cardiac silhouette and mediastinal contours.  Lung volumes are slightly reduced with minimal perihilar linear opacities favored to represent atelectasis.  No focal airspace opacity.  No pleural effusion or pneumothorax.  Paucity of bowel gas without evidence of obstruction.  No pneumoperitoneum, pneumatosis or portal venous gas.  No abnormal intra-abdominal calcifications.  No acute osseous abnormalities.  IMPRESSION: 1.  No acute cardiopulmonary disease. 2.  Nonobstructive bowel gas pattern.   Original Report Authenticated By: Tacey Ruiz, MD     Microbiology: Recent Results (from the past 240 hour(s))  CULTURE, BLOOD (ROUTINE X 2)     Status: None   Collection Time    02/08/13  4:28 PM      Result Value Range Status   Specimen Description BLOOD LEFT ANTECUBITAL   Final   Special Requests BOTTLES DRAWN AEROBIC ONLY 8CC   Final   Culture NO GROWTH 2 DAYS   Final   Report Status PENDING   Incomplete  ANAEROBIC CULTURE     Status: None   Collection Time    02/08/13  7:25 PM      Result Value  Range Status   Specimen Description OTHER   Final   Special Requests Normal   Final   Gram Stain     Final   Value: RARE WBC PRESENT, PREDOMINANTLY PMN     RARE SQUAMOUS EPITHELIAL CELLS PRESENT     FEW GRAM POSITIVE RODS   Culture     Final   Value: NO ANAEROBES ISOLATED; CULTURE IN PROGRESS FOR 5 DAYS   Report Status PENDING   Incomplete  CULTURE, ROUTINE-ABSCESS     Status: None   Collection Time    02/08/13  7:25 PM      Result Value Range Status   Specimen Description PELVIS  Final   Special Requests NONE   Final   Gram Stain     Final   Value: RARE WBC PRESENT, PREDOMINANTLY PMN     RARE SQUAMOUS EPITHELIAL CELLS PRESENT     FEW GRAM POSITIVE RODS   Culture NO GROWTH   Final   Report Status PENDING   Incomplete  GC/CHLAMYDIA PROBE AMP     Status: None   Collection Time    02/08/13  7:28 PM      Result Value Range Status   CT Probe RNA NEGATIVE  NEGATIVE Final   GC Probe RNA NEGATIVE  NEGATIVE Final   Comment: (NOTE)                                                                                              Normal Reference Range: Negative          Assay performed using the Gen-Probe APTIMA COMBO2 (R) Assay.     Acceptable specimen types for this assay include APTIMA Swabs (Unisex,     endocervical, urethral, or vaginal), first void urine, and ThinPrep     liquid based cytology samples.  CULTURE, BLOOD (ROUTINE X 2)     Status: None   Collection Time    02/08/13  7:31 PM      Result Value Range Status   Specimen Description BLOOD RIGHT ANTECUBITAL   Final   Special Requests BOTTLES DRAWN AEROBIC AND ANAEROBIC 10CC   Final   Culture NO GROWTH 2 DAYS   Final   Report Status PENDING   Incomplete     Labs: Basic Metabolic Panel:  Recent Labs Lab 02/08/13 1628 02/09/13 0504  NA 137 139  K 3.7 3.8  CL 100 105  CO2 28 26  GLUCOSE 88 103*  BUN 8 7  CREATININE 0.73 0.72  CALCIUM 9.5 8.7   Liver Function Tests:  Recent Labs Lab 02/08/13 1628  02/09/13 0504  AST 15 14  ALT 13 11  ALKPHOS 105 98  BILITOT 0.5 0.4  PROT 7.6 6.7  ALBUMIN 4.0 3.4*   No results found for this basename: LIPASE, AMYLASE,  in the last 168 hours No results found for this basename: AMMONIA,  in the last 168 hours CBC:  Recent Labs Lab 02/08/13 1628 02/09/13 0504  WBC 10.8* 9.1  NEUTROABS 8.4*  --   HGB 13.6 12.6  HCT 39.9 37.7  MCV 85.1 86.9  PLT 214 193   Cardiac Enzymes:  Recent Labs Lab 02/10/13 0550  CKTOTAL 53  CKMB 1.0  TROPONINI <0.30   BNP: BNP (last 3 results) No results found for this basename: PROBNP,  in the last 8760 hours CBG: No results found for this basename: GLUCAP,  in the last 168 hours     Signed:  Trigger Frasier  [facility] 02/10/2013, 12:36 PM

## 2013-02-13 LAB — CULTURE, BLOOD (ROUTINE X 2)
Culture: NO GROWTH
Culture: NO GROWTH

## 2013-02-13 LAB — CULTURE, ROUTINE-ABSCESS

## 2013-02-14 LAB — ANAEROBIC CULTURE

## 2014-05-26 LAB — OB RESULTS CONSOLE HIV ANTIBODY (ROUTINE TESTING): HIV: NONREACTIVE

## 2014-05-26 LAB — OB RESULTS CONSOLE GC/CHLAMYDIA
Chlamydia: NEGATIVE
GC PROBE AMP, GENITAL: NEGATIVE

## 2014-05-26 LAB — OB RESULTS CONSOLE ABO/RH: RH Type: POSITIVE

## 2014-05-26 LAB — OB RESULTS CONSOLE RUBELLA ANTIBODY, IGM: Rubella: IMMUNE

## 2014-05-26 LAB — OB RESULTS CONSOLE RPR: RPR: NONREACTIVE

## 2014-05-26 LAB — OB RESULTS CONSOLE HEPATITIS B SURFACE ANTIGEN: Hepatitis B Surface Ag: NEGATIVE

## 2014-05-26 LAB — OB RESULTS CONSOLE ANTIBODY SCREEN: ANTIBODY SCREEN: NEGATIVE

## 2014-08-26 ENCOUNTER — Encounter (HOSPITAL_COMMUNITY): Payer: Self-pay

## 2014-08-26 ENCOUNTER — Inpatient Hospital Stay (HOSPITAL_COMMUNITY)
Admission: AD | Admit: 2014-08-26 | Discharge: 2014-08-26 | Disposition: A | Discharge status: Home / Self Care | Payer: BC Managed Care – PPO | Source: Ambulatory Visit | Attending: Obstetrics and Gynecology | Admitting: Obstetrics and Gynecology

## 2014-08-26 DIAGNOSIS — R109 Unspecified abdominal pain: Secondary | ICD-10-CM | POA: Diagnosis not present

## 2014-08-26 DIAGNOSIS — O9989 Other specified diseases and conditions complicating pregnancy, childbirth and the puerperium: Secondary | ICD-10-CM

## 2014-08-26 DIAGNOSIS — Z3A21 21 weeks gestation of pregnancy: Secondary | ICD-10-CM | POA: Diagnosis not present

## 2014-08-26 DIAGNOSIS — O26892 Other specified pregnancy related conditions, second trimester: Secondary | ICD-10-CM | POA: Diagnosis not present

## 2014-08-26 DIAGNOSIS — O26899 Other specified pregnancy related conditions, unspecified trimester: Secondary | ICD-10-CM

## 2014-08-26 LAB — URINALYSIS, ROUTINE W REFLEX MICROSCOPIC
Bilirubin Urine: NEGATIVE
GLUCOSE, UA: NEGATIVE mg/dL
HGB URINE DIPSTICK: NEGATIVE
Ketones, ur: NEGATIVE mg/dL
Leukocytes, UA: NEGATIVE
Nitrite: NEGATIVE
Protein, ur: NEGATIVE mg/dL
SPECIFIC GRAVITY, URINE: 1.01 (ref 1.005–1.030)
Urobilinogen, UA: 0.2 mg/dL (ref 0.0–1.0)
pH: 6.5 (ref 5.0–8.0)

## 2014-08-26 NOTE — MAU Provider Note (Signed)
History     CSN: [id number]  Arrival date and time: 08/26/14 1544   None     Chief Complaint  Patient presents with  . Abdominal Pain   HPI Vanessa SartoriusBrittany Burnett is 23 y.o. G1P0 4751w4d weeks presenting with report of 4 episodes of abdominal pain at 1300.  Pain went away and returned on her way to the hospital.  She called the office and instructed to come here for evaluation.  She denies vaginal bleeding, abnormal discharge.  Normal BM X 2 today.  Had lunch at [employer].  She is a patient of Dr. Lilyan PuntMorris's.   Past Medical History  Diagnosis Date  . Medical history non-contributory     Past Surgical History  Procedure Laterality Date  . No past surgeries      No family history on file.  History  Substance Use Topics  . Smoking status: Never Smoker   . Smokeless tobacco: Not on file  . Alcohol Use: No    Allergies:  Allergies  Allergen Reactions  . Bactrim [Sulfamethoxazole-Trimethoprim] Diarrhea and Nausea And Vomiting  . Penicillins Hives and Swelling  . Vancomycin Other (See Comments)    Red man syndrome  . Zithromax [Azithromycin] Diarrhea and Nausea And Vomiting    Prescriptions prior to admission  Medication Sig Dispense Refill Last Dose  . clindamycin (CLEOCIN) 300 MG capsule Take 1 capsule (300 mg total) by mouth 3 (three) times daily. 21 capsule 0   . fluconazole (DIFLUCAN) 150 MG tablet Take 1 tablet (150 mg total) by mouth once. 1 tablet 0   . hydrOXYzine (ATARAX/VISTARIL) 25 MG tablet Take 25 mg by mouth daily as needed (for panic attacks).   Past Week at Unknown  . Multiple Vitamins-Minerals (MULTIVITAMIN GUMMIES ADULT PO) Take 1-2 each by mouth daily.   Past Week at Unknown    Review of Systems  Constitutional: Negative for fever and chills.  Gastrointestinal: Positive for abdominal pain. Negative for nausea, vomiting, diarrhea and constipation.  Genitourinary: Negative for dysuria, urgency, frequency and hematuria.       Neg vaginal bleeding, abnormal  discharge  Neurological: Negative for headaches.   Physical Exam   Blood pressure 127/76, pulse 105, temperature 97.9 F (36.6 C), temperature source Oral, resp. rate 16, height 5' 5.5" (1.664 m), weight 185 lb (83.915 kg), SpO2 100 %.  Physical Exam  Constitutional: She is oriented to person, place, and time. She appears well-developed and well-nourished. No distress.  HENT:  Head: Normocephalic.  Neck: Normal range of motion.  Cardiovascular: Normal rate.   Respiratory: Effort normal.  GI: Soft. She exhibits no distension and no mass. There is no tenderness. There is no rebound and no guarding.  Genitourinary: There is no rash, tenderness or lesion on the right labia. There is no rash, tenderness or lesion on the left labia. Uterus is enlarged (21 week size). Uterus is not tender. Cervix exhibits no motion tenderness, no discharge and no friability. No erythema, tenderness or bleeding in the vagina. No signs of injury around the vagina. No vaginal discharge found.  Cervix is closed.  Neurological: She is alert and oriented to person, place, and time.  Skin: Skin is warm and dry.  Psychiatric: She has a normal mood and affect. Her behavior is normal.   FETAL HEART RATE by doppler 146  MAU Course  Procedures  No contractions on TOCO  MDM Discussed patient MSE, findings on physical exam, FHR and TOCO findings.  Reassure and may discharge her to home.  Follow up as scheduled or call if sxs worsen.  Assessment and Plan  A:  Abdominal pain in second trimester pregnancy-resolved  P:  Encouraged more bland diet for the next few days      Call Dr. Renaldo FiddlerAdkins for increased sxs  KEY,EVE M 08/26/2014, 4:39 PM

## 2014-08-26 NOTE — MAU Note (Signed)
Patient states she had abdominal pain at about 1300 for about 30 minutes, 4 times. Stopped then on the way to the hospital she felt again. Denies bleeding, leaking, nausea, vomiting or diarrhea. Has felt fetal movement.

## 2014-08-26 NOTE — Discharge Instructions (Signed)

## 2014-10-07 NOTE — L&D Delivery Note (Signed)
Delivery Note  SVD viable female Apgars 7,8 over 2nd degree ML lac.  Placenta delivered spontaneously intact with 3VC. Repair with 2-0 Chromic with good support and hemostasis noted and R/V exam confirms.  PH art was sent.  [place] cord blood was not done.  Mother and baby were doing well.  EBL 300cc  Candice Campavid Aolanis Crispen, MD

## 2014-11-29 LAB — OB RESULTS CONSOLE GBS: STREP GROUP B AG: NEGATIVE

## 2014-12-21 ENCOUNTER — Encounter (HOSPITAL_COMMUNITY): Payer: Self-pay | Admitting: *Deleted

## 2014-12-21 ENCOUNTER — Inpatient Hospital Stay (HOSPITAL_COMMUNITY)
Admission: AD | Admit: 2014-12-21 | Discharge: 2014-12-22 | Disposition: A | Discharge status: Home / Self Care | Payer: BLUE CROSS/BLUE SHIELD | Source: Ambulatory Visit | Attending: Obstetrics and Gynecology | Admitting: Obstetrics and Gynecology

## 2014-12-21 DIAGNOSIS — R109 Unspecified abdominal pain: Secondary | ICD-10-CM

## 2014-12-21 DIAGNOSIS — Z3A38 38 weeks gestation of pregnancy: Secondary | ICD-10-CM | POA: Diagnosis not present

## 2014-12-21 DIAGNOSIS — R1011 Right upper quadrant pain: Secondary | ICD-10-CM | POA: Insufficient documentation

## 2014-12-21 DIAGNOSIS — O26899 Other specified pregnancy related conditions, unspecified trimester: Secondary | ICD-10-CM

## 2014-12-21 DIAGNOSIS — O9989 Other specified diseases and conditions complicating pregnancy, childbirth and the puerperium: Secondary | ICD-10-CM | POA: Diagnosis not present

## 2014-12-21 LAB — URINALYSIS, ROUTINE W REFLEX MICROSCOPIC
Bilirubin Urine: NEGATIVE
GLUCOSE, UA: NEGATIVE mg/dL
Hgb urine dipstick: NEGATIVE
KETONES UR: NEGATIVE mg/dL
LEUKOCYTES UA: NEGATIVE
Nitrite: NEGATIVE
PROTEIN: NEGATIVE mg/dL
Specific Gravity, Urine: 1.015 (ref 1.005–1.030)
UROBILINOGEN UA: 0.2 mg/dL (ref 0.0–1.0)
pH: 6 (ref 5.0–8.0)

## 2014-12-21 NOTE — MAU Note (Signed)
Pt reports she has had elevated B/P 's in the office and tonight about 2130 she started having right side pain and diarrhea. Was told to come in for right side pain.

## 2014-12-22 ENCOUNTER — Telehealth (HOSPITAL_COMMUNITY): Payer: Self-pay | Admitting: *Deleted

## 2014-12-22 ENCOUNTER — Encounter (HOSPITAL_COMMUNITY): Payer: Self-pay | Admitting: *Deleted

## 2014-12-22 DIAGNOSIS — R1011 Right upper quadrant pain: Secondary | ICD-10-CM | POA: Diagnosis not present

## 2014-12-22 LAB — COMPREHENSIVE METABOLIC PANEL
ALBUMIN: 3.2 g/dL — AB (ref 3.5–5.2)
ALK PHOS: 207 U/L — AB (ref 39–117)
ALT: 13 U/L (ref 0–35)
AST: 20 U/L (ref 0–37)
Anion gap: 9 (ref 5–15)
BILIRUBIN TOTAL: 0.4 mg/dL (ref 0.3–1.2)
BUN: 10 mg/dL (ref 6–23)
CO2: 20 mmol/L (ref 19–32)
Calcium: 8.9 mg/dL (ref 8.4–10.5)
Chloride: 106 mmol/L (ref 96–112)
Creatinine, Ser: 0.47 mg/dL — ABNORMAL LOW (ref 0.50–1.10)
GFR calc non Af Amer: >90 mL/min (ref >90)
Glucose, Bld: 101 mg/dL — ABNORMAL HIGH (ref 70–99)
POTASSIUM: 3.7 mmol/L (ref 3.5–5.1)
SODIUM: 135 mmol/L (ref 135–145)
Total Protein: 6.9 g/dL (ref 6.0–8.3)

## 2014-12-22 LAB — CBC
HEMATOCRIT: 38.6 % (ref 36.0–46.0)
HEMOGLOBIN: 12.9 g/dL (ref 12.0–15.0)
MCH: 28.8 pg (ref 26.0–34.0)
MCHC: 33.4 g/dL (ref 30.0–36.0)
MCV: 86.2 fL (ref 78.0–100.0)
PLATELETS: 171 10*3/uL (ref 150–400)
RBC: 4.48 MIL/uL (ref 3.87–5.11)
RDW: 13.8 % (ref 11.5–15.5)
WBC: 13.9 10*3/uL — AB (ref 4.0–10.5)

## 2014-12-22 LAB — PROTEIN / CREATININE RATIO, URINE
Creatinine, Urine: 90 mg/dL
Protein Creatinine Ratio: 0.1 (ref 0.00–0.15)
Total Protein, Urine: 9 mg/dL

## 2014-12-22 NOTE — Telephone Encounter (Signed)
Preadmission screen  

## 2014-12-22 NOTE — MAU Provider Note (Signed)
Chief Complaint:  No chief complaint on file.   First Provider Initiated Contact with Patient 12/22/14 0059     HPI: Vanessa Stone is a 24 y.o. G1P0 at [redacted]w[redacted]d who presents to maternity admissions reporting right upper quadrant pain and few episodes of diarrhea 30 minutes this evening. Had a few painful contractions around the time that she had diarrhea. No further contractions or diarrhea. States she was told her blood pressure was elevated in office. Called after hours number and told to come to maternity admissions for evaluation.   Denies headache, vision changes, leakage of fluid or vaginal bleeding. Good fetal movement.   Pregnancy Course: Uncomplicated  Past Medical History: Past Medical History  Diagnosis Date  . Medical history non-contributory     Past obstetric history: OB History  Gravida Para Term Preterm AB SAB TAB Ectopic Multiple Living  1             # Outcome Date GA Lbr Len/2nd Weight Sex Delivery Anes PTL Lv  1 Current               Past Surgical History: Past Surgical History  Procedure Laterality Date  . No past surgeries    . Wisdom tooth extraction       Family History: History reviewed. No pertinent family history.  Social History: History  Substance Use Topics  . Smoking status: Never Smoker   . Smokeless tobacco: Not on file  . Alcohol Use: No    Allergies:  Allergies  Allergen Reactions  . Bactrim [Sulfamethoxazole-Trimethoprim] Diarrhea and Nausea And Vomiting  . Penicillins Hives and Swelling  . Vancomycin Other (See Comments)    Red man syndrome  . Zithromax [Azithromycin] Diarrhea and Nausea And Vomiting    Meds:  Prescriptions prior to admission  Medication Sig Dispense Refill Last Dose  . Prenatal Vit-Fe Fumarate-FA (PRENATAL MULTIVITAMIN) TABS tablet Take 1 tablet by mouth daily at 12 noon.   12/21/2014 at Unknown time  . acetaminophen (TYLENOL) 325 MG tablet Take 325 mg by mouth every 6 (six) hours as needed for mild pain  or moderate pain.   Unknown at Unknown time    ROS:  Review of Systems  Constitutional: Negative for fever and chills.  Eyes: Negative for blurred vision.  Respiratory: Negative for shortness of breath.   Cardiovascular: Negative for leg swelling.  Gastrointestinal: Positive for abdominal pain and diarrhea. Negative for nausea, vomiting, constipation and blood in stool.  Genitourinary: Negative for dysuria, urgency, frequency, hematuria and flank pain.  Neurological: Negative for headaches.    Physical Exam  Blood pressure 134/78, pulse 97, temperature 98.2 F (36.8 C), temperature source Oral, resp. rate 18, height 5' 5.5" (1.664 m), weight 96.163 kg (212 lb).  Patient Vitals for the past 24 hrs:  BP Temp Temp src Pulse Resp Height Weight  12/22/14 0009 134/78 mmHg - - 97 - - -  12/21/14 2359 128/78 mmHg - - 100 - - -  12/21/14 2349 126/89 mmHg - - 100 - - -  12/21/14 2346 133/88 mmHg 98.2 F (36.8 C) Oral 97 18 5' 5.5" (1.664 m) 96.163 kg (212 lb)    GENERAL: Well-developed, well-nourished female in no acute distress.  HEENT: normocephalic HEART: normal rate RESP: normal effort ABDOMEN: Soft, non-tender, gravid appropriate for gestational age EXTREMITIES: Nontender, no edema NEURO: alert and oriented SPECULUM EXAM: Deferred    FHT:  Baseline 135 , moderate variability, accelerations present, no decelerations Contractions: Irregular, mild   Labs: Results for  orders placed or performed during the hospital encounter of 12/21/14 (from the past 24 hour(s))  Urinalysis, Routine w reflex microscopic     Status: None   Collection Time: 12/21/14 11:25 PM  Result Value Ref Range   Color, Urine YELLOW YELLOW   APPearance CLEAR CLEAR   Specific Gravity, Urine 1.015 1.005 - 1.030   pH 6.0 5.0 - 8.0   Glucose, UA NEGATIVE NEGATIVE mg/dL   Hgb urine dipstick NEGATIVE NEGATIVE   Bilirubin Urine NEGATIVE NEGATIVE   Ketones, ur NEGATIVE NEGATIVE mg/dL   Protein, ur NEGATIVE  NEGATIVE mg/dL   Urobilinogen, UA 0.2 0.0 - 1.0 mg/dL   Nitrite NEGATIVE NEGATIVE   Leukocytes, UA NEGATIVE NEGATIVE  Protein / creatinine ratio, urine     Status: None   Collection Time: 12/21/14 11:25 PM  Result Value Ref Range   Creatinine, Urine 90.00 mg/dL   Total Protein, Urine 9 mg/dL   Protein Creatinine Ratio 0.10 0.00 - 0.15  CBC     Status: Abnormal   Collection Time: 12/22/14 12:19 AM  Result Value Ref Range   WBC 13.9 (H) 4.0 - 10.5 K/uL   RBC 4.48 3.87 - 5.11 MIL/uL   Hemoglobin 12.9 12.0 - 15.0 g/dL   HCT 13.0 86.5 - 78.4 %   MCV 86.2 78.0 - 100.0 fL   MCH 28.8 26.0 - 34.0 pg   MCHC 33.4 30.0 - 36.0 g/dL   RDW 69.6 29.5 - 28.4 %   Platelets 171 150 - 400 K/uL  Comprehensive metabolic panel     Status: Abnormal   Collection Time: 12/22/14 12:19 AM  Result Value Ref Range   Sodium 135 135 - 145 mmol/L   Potassium 3.7 3.5 - 5.1 mmol/L   Chloride 106 96 - 112 mmol/L   CO2 20 19 - 32 mmol/L   Glucose, Bld 101 (H) 70 - 99 mg/dL   BUN 10 6 - 23 mg/dL   Creatinine, Ser 1.32 (L) 0.50 - 1.10 mg/dL   Calcium 8.9 8.4 - 44.0 mg/dL   Total Protein 6.9 6.0 - 8.3 g/dL   Albumin 3.2 (L) 3.5 - 5.2 g/dL   AST 20 0 - 37 U/L   ALT 13 0 - 35 U/L   Alkaline Phosphatase 207 (H) 39 - 117 U/L   Total Bilirubin 0.4 0.3 - 1.2 mg/dL   GFR calc non Af Amer >90 >90 mL/min   GFR calc Af Amer >90 >90 mL/min   Anion gap 9 5 - 15    Imaging:  No results found. MAU Course:  Assessment:   ICD-9-CM ICD-10-CM   1. RUQ abdominal pain 789.01 R10.11 Urinalysis, Routine w reflex microscopic     CBC     Comprehensive metabolic panel     Protein / creatinine ratio, urine  2. Abdominal pain affecting pregnancy, antepartum 646.83 O99.89 Urinalysis, Routine w reflex microscopic   789.00 R10.9 CBC     Comprehensive metabolic panel     Protein / creatinine ratio, urine    Plan: Discharge home in stable condition per consult with Dr. Marcelle Overlie. Preeclampsia precautions. Labor precautions and  fetal kick counts   Follow-up Information    Follow up with Meriel Pica, MD.   Specialty:  Obstetrics and Gynecology   Why:  As scheduled   Contact information:   [address] [place] [address] [phone]       Follow up with [facility].   Why:  As needed in emergencies   Contact information:   [address] [id number] [place] [id number] [phone]        Medication List    ASK your doctor about these medications        acetaminophen 325 MG tablet  Commonly known as:  TYLENOL  Take 325 mg by mouth every 6 (six) hours as needed for mild pain or moderate pain.     prenatal multivitamin Tabs tablet  Take 1 tablet by mouth daily at 12 noon.       [place] Bottari, CNM 12/22/2014 12:58 AM

## 2014-12-22 NOTE — Discharge Instructions (Signed)
Hypertension During Pregnancy °Hypertension, or high blood pressure, is when there is extra pressure inside your blood vessels that carry blood from the heart to the rest of your body (arteries). It can happen at any time in life, including pregnancy. Hypertension during pregnancy can cause problems for you and your baby. Your baby might not weigh as much as he or she should at birth or might be born early (premature). Very bad cases of hypertension during pregnancy can be life-threatening.  °Different types of hypertension can occur during pregnancy. These include: °· Chronic hypertension. This happens when a woman has hypertension before pregnancy and it continues during pregnancy. °· Gestational hypertension. This is when hypertension develops during pregnancy. °· Preeclampsia or toxemia of pregnancy. This is a very serious type of hypertension that develops only during pregnancy. It affects the whole body and can be very dangerous for both mother and baby.   °Gestational hypertension and preeclampsia usually go away after your baby is born. Your blood pressure will likely stabilize within 6 weeks. Women who have hypertension during pregnancy have a greater chance of developing hypertension later in life or with future pregnancies. °RISK FACTORS °There are certain factors that make it more likely for you to develop hypertension during pregnancy. These include: °· Having hypertension before pregnancy. °· Having hypertension during a previous pregnancy. °· Being overweight. °· Being older than 40 years. °· Being pregnant with more than one baby. °· Having diabetes or kidney problems. °SIGNS AND SYMPTOMS °Chronic and gestational hypertension rarely cause symptoms. Preeclampsia has symptoms, which may include: °· Increased protein in your urine. Your health care provider will check for this at every prenatal visit. °· Swelling of your hands and face. °· Rapid weight gain. °· Headaches. °· Visual changes. °· Being  bothered by light. °· Abdominal pain, especially in the upper right area. °· Chest pain. °· Shortness of breath. °· Increased reflexes. °· Seizures. These occur with a more severe form of preeclampsia, called eclampsia. °DIAGNOSIS  °You may be diagnosed with hypertension during a regular prenatal exam. At each prenatal visit, you may have: °· Your blood pressure checked. °· A urine test to check for protein in your urine. °The type of hypertension you are diagnosed with depends on when you developed it. It also depends on your specific blood pressure reading. °· Developing hypertension before 20 weeks of pregnancy is consistent with chronic hypertension. °· Developing hypertension after 20 weeks of pregnancy is consistent with gestational hypertension. °· Hypertension with increased urinary protein is diagnosed as preeclampsia. °· Blood pressure measurements that stay above 160 systolic or 110 diastolic are a sign of severe preeclampsia. °TREATMENT °Treatment for hypertension during pregnancy varies. Treatment depends on the type of hypertension and how serious it is. °· If you take medicine for chronic hypertension, you may need to switch medicines. °¨ Medicines called ACE inhibitors should not be taken during pregnancy. °¨ Low-dose aspirin may be suggested for women who have risk factors for preeclampsia. °· If you have gestational hypertension, you may need to take a blood pressure medicine that is safe during pregnancy. Your health care provider will recommend the correct medicine. °· If you have severe preeclampsia, you may need to be in the hospital. Health care providers will watch you and your baby very closely. You also may need to take medicine called magnesium sulfate to prevent seizures and lower blood pressure. °· Sometimes, an early delivery is needed. This may be the case if the condition worsens. It would be   done to protect you and your baby. The only cure for preeclampsia is delivery. °· Your health  care provider may recommend that you take one low-dose aspirin (81 mg) each day to help prevent high blood pressure during your pregnancy if you are at risk for preeclampsia. You may be at risk for preeclampsia if: °¨ You had preeclampsia or eclampsia during a previous pregnancy. °¨ Your baby did not grow as expected during a previous pregnancy. °¨ You experienced preterm birth with a previous pregnancy. °¨ You experienced a separation of the placenta from the uterus (placental abruption) during a previous pregnancy. °¨ You experienced the loss of your baby during a previous pregnancy. °¨ You are pregnant with more than one baby. °¨ You have other medical conditions, such as diabetes or an autoimmune disease. °HOME CARE INSTRUCTIONS °· Schedule and keep all of your regular prenatal care appointments. This is important. °· Take medicines only as directed by your health care provider. Tell your health care provider about all medicines you take. °· Eat as little salt as possible. °· Get regular exercise. °· Do not drink alcohol. °· Do not use tobacco products. °· Do not drink products with caffeine. °· Lie on your left side when resting. °SEEK IMMEDIATE MEDICAL CARE IF: °· You have severe abdominal pain. °· You have sudden swelling in your hands, ankles, or face. °· You gain 4 pounds (1.8 kg) or more in 1 week. °· You vomit repeatedly. °· You have vaginal bleeding. °· You do not feel your baby moving as much. °· You have a headache. °· You have blurred or double vision. °· You have muscle twitching or spasms. °· You have shortness of breath. °· You have blue fingernails or lips. °· You have blood in your urine. °MAKE SURE YOU: °· Understand these instructions. °· Will watch your condition. °· Will get help right away if you are not doing well or get worse. °Document Released: 06/11/2011 Document Revised: 02/07/2014 Document Reviewed: 04/22/2013 °Exit[employer]® Patient Information ©2015 [employer]. This information is not  intended to replace advice given to you by your health care provider. Make sure you discuss any questions you have with your health care provider. °Braxton Hicks Contractions °Contractions of the uterus can occur throughout pregnancy. Contractions are not always a sign that you are in labor.  °WHAT ARE BRAXTON HICKS CONTRACTIONS?  °Contractions that occur before labor are called Braxton Hicks contractions, or false labor. Toward the end of pregnancy (32-34 weeks), these contractions can develop more often and may become more forceful. This is not true labor because these contractions do not result in opening (dilatation) and thinning of the cervix. They are sometimes difficult to tell apart from true labor because these contractions can be forceful and people have different pain tolerances. You should not feel embarrassed if you go to the hospital with false labor. Sometimes, the only way to tell if you are in true labor is for your health care provider to look for changes in the cervix. °If there are no prenatal problems or other health problems associated with the pregnancy, it is completely safe to be sent home with false labor and await the onset of true labor. °HOW CAN YOU TELL THE DIFFERENCE BETWEEN TRUE AND FALSE LABOR? °False Labor °· The contractions of false labor are usually shorter and not as hard as those of true labor.   °· The contractions are usually irregular.   °· The contractions are often felt in the front of the lower   abdomen and in the groin.   °· The contractions may go away when you walk around or change positions while lying down.   °· The contractions get weaker and are shorter lasting as time goes on.   °· The contractions do not usually become progressively stronger, regular, and closer together as with true labor.   °True Labor °· Contractions in true labor last 30-70 seconds, become very regular, usually become more intense, and increase in frequency.   °· The contractions do not go  away with walking.   °· The discomfort is usually felt in the top of the uterus and spreads to the lower abdomen and low back.   °· True labor can be determined by your health care provider with an exam. This will show that the cervix is dilating and getting thinner.   °WHAT TO REMEMBER °· Keep up with your usual exercises and follow other instructions given by your health care provider.   °· Take medicines as directed by your health care provider.   °· Keep your regular prenatal appointments.   °· Eat and drink lightly if you think you are going into labor.   °· If Braxton Hicks contractions are making you uncomfortable:   °¨ Change your position from lying down or resting to walking, or from walking to resting.   °¨ Sit and rest in a tub of warm water.   °¨ Drink 2-3 glasses of water. Dehydration may cause these contractions.   °¨ Do slow and deep breathing several times an hour.   °WHEN SHOULD I SEEK IMMEDIATE MEDICAL CARE? °Seek immediate medical care if: °· Your contractions become stronger, more regular, and closer together.   °· You have fluid leaking or gushing from your vagina.   °· You have a fever.   °· You pass blood-tinged mucus.   °· You have vaginal bleeding.   °· You have continuous abdominal pain.   °· You have low back pain that you never had before.   °· You feel your baby's head pushing down and causing pelvic pressure.   °· Your baby is not moving as much as it used to.   °Document Released: 09/23/2005 Document Revised: 09/28/2013 Document Reviewed: 07/05/2013 °ExitCare® Patient Information ©2015 [employer]. This information is not intended to replace advice given to you by your health care provider. Make sure you discuss any questions you have with your health care provider. ° °

## 2014-12-26 ENCOUNTER — Inpatient Hospital Stay (HOSPITAL_COMMUNITY): Admission: RE | Admit: 2014-12-26 | Payer: BLUE CROSS/BLUE SHIELD | Source: Ambulatory Visit

## 2014-12-26 ENCOUNTER — Inpatient Hospital Stay (HOSPITAL_COMMUNITY)
Admission: AD | Admit: 2014-12-26 | Discharge: 2014-12-29 | DRG: 775 | Disposition: A | Discharge status: Home / Self Care | Payer: BLUE CROSS/BLUE SHIELD | Source: Ambulatory Visit | Attending: Obstetrics and Gynecology | Admitting: Obstetrics and Gynecology

## 2014-12-26 DIAGNOSIS — O133 Gestational [pregnancy-induced] hypertension without significant proteinuria, third trimester: Principal | ICD-10-CM | POA: Diagnosis present

## 2014-12-26 DIAGNOSIS — Z3A39 39 weeks gestation of pregnancy: Secondary | ICD-10-CM | POA: Diagnosis present

## 2014-12-26 NOTE — MAU Note (Signed)
Pt here for induction, unable to get to L/D room at this time.

## 2014-12-27 ENCOUNTER — Inpatient Hospital Stay (HOSPITAL_COMMUNITY): Payer: BLUE CROSS/BLUE SHIELD | Admitting: Anesthesiology

## 2014-12-27 ENCOUNTER — Encounter (HOSPITAL_COMMUNITY): Payer: Self-pay | Admitting: *Deleted

## 2014-12-27 DIAGNOSIS — O133 Gestational [pregnancy-induced] hypertension without significant proteinuria, third trimester: Secondary | ICD-10-CM | POA: Diagnosis present

## 2014-12-27 DIAGNOSIS — Z3A39 39 weeks gestation of pregnancy: Secondary | ICD-10-CM | POA: Diagnosis present

## 2014-12-27 DIAGNOSIS — Z3403 Encounter for supervision of normal first pregnancy, third trimester: Secondary | ICD-10-CM | POA: Diagnosis present

## 2014-12-27 LAB — COMPREHENSIVE METABOLIC PANEL
ALBUMIN: 3 g/dL — AB (ref 3.5–5.2)
ALK PHOS: 206 U/L — AB (ref 39–117)
ALT: 13 U/L (ref 0–35)
AST: 20 U/L (ref 0–37)
Anion gap: 10 (ref 5–15)
BILIRUBIN TOTAL: 0.4 mg/dL (ref 0.3–1.2)
BUN: 8 mg/dL (ref 6–23)
CHLORIDE: 103 mmol/L (ref 96–112)
CO2: 22 mmol/L (ref 19–32)
Calcium: 9.4 mg/dL (ref 8.4–10.5)
Creatinine, Ser: 0.54 mg/dL (ref 0.50–1.10)
GFR calc Af Amer: >90 mL/min (ref >90)
GFR calc non Af Amer: >90 mL/min (ref >90)
Glucose, Bld: 95 mg/dL (ref 70–99)
POTASSIUM: 4 mmol/L (ref 3.5–5.1)
Sodium: 135 mmol/L (ref 135–145)
TOTAL PROTEIN: 6.6 g/dL (ref 6.0–8.3)

## 2014-12-27 LAB — RPR: RPR Ser Ql: NONREACTIVE

## 2014-12-27 LAB — CBC
HCT: 38.1 % (ref 36.0–46.0)
HCT: 39.3 % (ref 36.0–46.0)
HEMOGLOBIN: 13.1 g/dL (ref 12.0–15.0)
Hemoglobin: 12.9 g/dL (ref 12.0–15.0)
MCH: 29.1 pg (ref 26.0–34.0)
MCH: 29 pg (ref 26.0–34.0)
MCHC: 33.9 g/dL (ref 30.0–36.0)
MCHC: 33.3 g/dL (ref 30.0–36.0)
MCV: 85.8 fL (ref 78.0–100.0)
MCV: 87.1 fL (ref 78.0–100.0)
Platelets: 178 10*3/uL (ref 150–400)
Platelets: 191 10*3/uL (ref 150–400)
RBC: 4.44 MIL/uL (ref 3.87–5.11)
RBC: 4.51 MIL/uL (ref 3.87–5.11)
RDW: 14 % (ref 11.5–15.5)
RDW: 14.1 % (ref 11.5–15.5)
WBC: 13.4 10*3/uL — ABNORMAL HIGH (ref 4.0–10.5)
WBC: 16.9 10*3/uL — ABNORMAL HIGH (ref 4.0–10.5)

## 2014-12-27 LAB — ABO/RH: ABO/RH(D): O POS

## 2014-12-27 LAB — TYPE AND SCREEN
ABO/RH(D): O POS
ANTIBODY SCREEN: NEGATIVE

## 2014-12-27 MED ORDER — FENTANYL 2.5 MCG/ML BUPIVACAINE 1/10 % EPIDURAL INFUSION (WH - ANES)
INTRAMUSCULAR | Status: DC | PRN
Start: 2014-12-27 — End: 2015-01-07
  Administered 2014-12-27: 14 mL/h via EPIDURAL

## 2014-12-27 MED ORDER — PHENYLEPHRINE 40 MCG/ML (10ML) SYRINGE FOR IV PUSH (FOR BLOOD PRESSURE SUPPORT)
80.0000 ug | PREFILLED_SYRINGE | INTRAVENOUS | Status: DC | PRN
  Filled 2014-12-27: qty 2
  Filled 2014-12-27: qty 20

## 2014-12-27 MED ORDER — DIPHENHYDRAMINE HCL 50 MG/ML IJ SOLN: 12.5000 mg | INTRAMUSCULAR | Status: DC | PRN

## 2014-12-27 MED ORDER — LIDOCAINE HCL (PF) 1 % IJ SOLN
INTRAMUSCULAR | Status: DC | PRN
  Administered 2014-12-27 (×2): 4 mL

## 2014-12-27 MED ORDER — MISOPROSTOL 25 MCG QUARTER TABLET
25.0000 ug | ORAL_TABLET | ORAL | Status: DC | PRN
  Administered 2014-12-27: 25 ug via VAGINAL
  Filled 2014-12-27: qty 1

## 2014-12-27 MED ORDER — ACETAMINOPHEN 325 MG PO TABS: 650.0000 mg | ORAL_TABLET | ORAL | Status: DC | PRN

## 2014-12-27 MED ORDER — EPHEDRINE 5 MG/ML INJ
10.0000 mg | INTRAVENOUS | Status: DC | PRN
  Filled 2014-12-27: qty 2

## 2014-12-27 MED ORDER — LACTATED RINGERS IV SOLN: 500.0000 mL | Freq: Once | INTRAVENOUS | Status: DC

## 2014-12-27 MED ORDER — FLEET ENEMA 7-19 GM/118ML RE ENEM
1.0000 | ENEMA | RECTAL | Status: DC | PRN
Start: 2014-12-27 — End: 2014-12-28

## 2014-12-27 MED ORDER — OXYTOCIN BOLUS FROM INFUSION
500.0000 mL | INTRAVENOUS | Status: DC
  Administered 2014-12-27: 500 mL via INTRAVENOUS

## 2014-12-27 MED ORDER — OXYTOCIN 40 UNITS IN LACTATED RINGERS INFUSION - SIMPLE MED
62.5000 mL/h | INTRAVENOUS | Status: DC
  Filled 2014-12-27: qty 1000

## 2014-12-27 MED ORDER — LIDOCAINE HCL (PF) 1 % IJ SOLN
30.0000 mL | INTRAMUSCULAR | Status: DC | PRN
  Filled 2014-12-27: qty 30

## 2014-12-27 MED ORDER — OXYCODONE-ACETAMINOPHEN 5-325 MG PO TABS: 2.0000 | ORAL_TABLET | ORAL | Status: DC | PRN

## 2014-12-27 MED ORDER — TERBUTALINE SULFATE 1 MG/ML IJ SOLN
0.2500 mg | Freq: Once | INTRAMUSCULAR | Status: AC | PRN
  Filled 2014-12-27: qty 1

## 2014-12-27 MED ORDER — PHENYLEPHRINE 40 MCG/ML (10ML) SYRINGE FOR IV PUSH (FOR BLOOD PRESSURE SUPPORT)
80.0000 ug | PREFILLED_SYRINGE | INTRAVENOUS | Status: DC | PRN
  Filled 2014-12-27: qty 2

## 2014-12-27 MED ORDER — ONDANSETRON HCL 4 MG/2ML IJ SOLN
4.0000 mg | Freq: Four times a day (QID) | INTRAMUSCULAR | Status: DC | PRN
Start: 2014-12-27 — End: 2014-12-28

## 2014-12-27 MED ORDER — FENTANYL 2.5 MCG/ML BUPIVACAINE 1/10 % EPIDURAL INFUSION (WH - ANES)
14.0000 mL/h | INTRAMUSCULAR | Status: DC | PRN
  Administered 2014-12-27 (×2): 14 mL/h via EPIDURAL
  Filled 2014-12-27 (×2): qty 125

## 2014-12-27 MED ORDER — LACTATED RINGERS IV SOLN
INTRAVENOUS | Status: DC
  Administered 2014-12-27 (×2): via INTRAVENOUS

## 2014-12-27 MED ORDER — OXYTOCIN 40 UNITS IN LACTATED RINGERS INFUSION - SIMPLE MED
1.0000 m[IU]/min | INTRAVENOUS | Status: DC
  Administered 2014-12-27: 2 m[IU]/min via INTRAVENOUS

## 2014-12-27 MED ORDER — LACTATED RINGERS IV SOLN: 500.0000 mL | INTRAVENOUS | Status: DC | PRN

## 2014-12-27 MED ORDER — OXYCODONE-ACETAMINOPHEN 5-325 MG PO TABS: 1.0000 | ORAL_TABLET | ORAL | Status: DC | PRN

## 2014-12-27 MED ORDER — CITRIC ACID-SODIUM CITRATE 334-500 MG/5ML PO SOLN
30.0000 mL | ORAL | Status: DC | PRN
Start: 2014-12-27 — End: 2014-12-28

## 2014-12-27 NOTE — Progress Notes (Signed)
Patient stating she feels like her vision is very blurry suddenly; BP within patient range and o2 sat 98%; no increased bleeding noted ; encouraged patient to eat a few bites of food at this time.

## 2014-12-27 NOTE — Anesthesia Procedure Notes (Signed)
Epidural Patient location during procedure: OB Start time: 12/27/2014 12:09 PM  Staffing Anesthesiologist: Mal AmabileFOSTER, Exzavier Ruderman Performed by: anesthesiologist   Preanesthetic Checklist Completed: patient identified, site marked, surgical consent, pre-op evaluation, timeout performed, IV checked, risks and benefits discussed and monitors and equipment checked  Epidural Patient position: sitting Prep: site prepped and draped and DuraPrep Patient monitoring: continuous pulse ox and blood pressure Approach: midline Location: L3-L4 Injection technique: LOR air  Needle:  Needle type: Tuohy  Needle gauge: 17 G Needle length: 9 cm and 9 Needle insertion depth: 6 cm Catheter type: closed end flexible Catheter size: 19 Gauge Catheter at skin depth: 11 cm Test dose: negative  Assessment Events: blood not aspirated, injection not painful, no injection resistance, negative IV test and no paresthesia  Additional Notes Patient identified. Risks and benefits discussed including failed block, incomplete  Pain control, post dural puncture headache, nerve damage, paralysis, blood pressure Changes, nausea, vomiting, reactions to medications-both toxic and allergic and post Partum back pain. All questions were answered. Patient expressed understanding and wished to proceed. Sterile technique was used throughout procedure. Epidural site was Dressed with sterile barrier dressing. No paresthesias, signs of intravascular injection Or signs of intrathecal spread were encountered.  Patient was more comfortable after the epidural was dosed. Please see RN's note for documentation of vital signs and FHR which are stable.

## 2014-12-27 NOTE — Anesthesia Preprocedure Evaluation (Signed)
Anesthesia Evaluation  Patient identified by MRN, date of birth, ID band Patient awake    Reviewed: Allergy & Precautions, Patient's Chart, lab work & pertinent test results  Airway Mallampati: III  TM Distance: >3 FB Neck ROM: Full    Dental no notable dental hx. (+) Teeth Intact   Pulmonary neg pulmonary ROS,  breath sounds clear to auscultation  Pulmonary exam normal       Cardiovascular hypertension, negative cardio ROS  Rhythm:Regular Rate:Normal     Neuro/Psych Anxiety negative neurological ROS     GI/Hepatic Neg liver ROS, GERD-  Medicated and Controlled,  Endo/Other  Obesity  Renal/GU negative Renal ROS  negative genitourinary   Musculoskeletal negative musculoskeletal ROS (+)   Abdominal (+) + obese,   Peds  Hematology negative hematology ROS (+)   Anesthesia Other Findings   Reproductive/Obstetrics (+) Pregnancy                             Anesthesia Physical Anesthesia Plan  ASA: II  Anesthesia Plan: Epidural   Post-op Pain Management:    Induction:   Airway Management Planned: Natural Airway  Additional Equipment:   Intra-op Plan:   Post-operative Plan:   Informed Consent: I have reviewed the patients History and Physical, chart, labs and discussed the procedure including the risks, benefits and alternatives for the proposed anesthesia with the patient or authorized representative who has indicated his/her understanding and acceptance.     Plan Discussed with: Anesthesiologist  Anesthesia Plan Comments:         Anesthesia Quick Evaluation
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
## 2014-12-27 NOTE — MAU Note (Signed)
Pt was scheduled for an induction, but waiting on bed in birthing suites

## 2014-12-28 LAB — CBC
HCT: 36.1 % (ref 36.0–46.0)
HEMOGLOBIN: 12.3 g/dL (ref 12.0–15.0)
MCH: 29.6 pg (ref 26.0–34.0)
MCHC: 34.1 g/dL (ref 30.0–36.0)
MCV: 86.8 fL (ref 78.0–100.0)
Platelets: 163 10*3/uL (ref 150–400)
RBC: 4.16 MIL/uL (ref 3.87–5.11)
RDW: 14 % (ref 11.5–15.5)
WBC: 18.1 10*3/uL — AB (ref 4.0–10.5)

## 2014-12-28 MED ORDER — TETANUS-DIPHTH-ACELL PERTUSSIS 5-2.5-18.5 LF-MCG/0.5 IM SUSP: 0.5000 mL | Freq: Once | INTRAMUSCULAR | Status: DC

## 2014-12-28 MED ORDER — DIBUCAINE 1 % RE OINT: 1.0000 "application " | TOPICAL_OINTMENT | RECTAL | Status: DC | PRN

## 2014-12-28 MED ORDER — LANOLIN HYDROUS EX OINT: TOPICAL_OINTMENT | CUTANEOUS | Status: DC | PRN

## 2014-12-28 MED ORDER — SENNOSIDES-DOCUSATE SODIUM 8.6-50 MG PO TABS
2.0000 | ORAL_TABLET | ORAL | Status: DC
Start: 2014-12-28 — End: 2014-12-29
  Administered 2014-12-28: 2 via ORAL
  Filled 2014-12-28: qty 2

## 2014-12-28 MED ORDER — OXYCODONE-ACETAMINOPHEN 5-325 MG PO TABS
2.0000 | ORAL_TABLET | ORAL | Status: DC | PRN
Start: 2014-12-28 — End: 2014-12-29

## 2014-12-28 MED ORDER — PRENATAL MULTIVITAMIN CH
1.0000 | ORAL_TABLET | Freq: Every day | ORAL | Status: DC
  Administered 2014-12-28 – 2014-12-29 (×2): 1 via ORAL
  Filled 2014-12-28 (×2): qty 1

## 2014-12-28 MED ORDER — WITCH HAZEL-GLYCERIN EX PADS
1.0000 "application " | MEDICATED_PAD | CUTANEOUS | Status: DC | PRN
  Administered 2014-12-28: 1 via TOPICAL

## 2014-12-28 MED ORDER — ONDANSETRON HCL 4 MG/2ML IJ SOLN: 4.0000 mg | INTRAMUSCULAR | Status: DC | PRN

## 2014-12-28 MED ORDER — IBUPROFEN 600 MG PO TABS
600.0000 mg | ORAL_TABLET | Freq: Four times a day (QID) | ORAL | Status: DC
  Administered 2014-12-28 – 2014-12-29 (×6): 600 mg via ORAL
  Filled 2014-12-28 (×6): qty 1

## 2014-12-28 MED ORDER — BENZOCAINE-MENTHOL 20-0.5 % EX AERO
1.0000 "application " | INHALATION_SPRAY | CUTANEOUS | Status: DC | PRN
  Administered 2014-12-28: 1 via TOPICAL
  Filled 2014-12-28: qty 56

## 2014-12-28 MED ORDER — ZOLPIDEM TARTRATE 5 MG PO TABS: 5.0000 mg | ORAL_TABLET | Freq: Every evening | ORAL | Status: DC | PRN

## 2014-12-28 MED ORDER — MEASLES, MUMPS & RUBELLA VAC ~~LOC~~ INJ
0.5000 mL | INJECTION | Freq: Once | SUBCUTANEOUS | Status: DC
  Filled 2014-12-28: qty 0.5

## 2014-12-28 MED ORDER — OXYCODONE-ACETAMINOPHEN 5-325 MG PO TABS: 1.0000 | ORAL_TABLET | ORAL | Status: DC | PRN

## 2014-12-28 MED ORDER — ONDANSETRON HCL 4 MG PO TABS: 4.0000 mg | ORAL_TABLET | ORAL | Status: DC | PRN

## 2014-12-28 MED ORDER — ACETAMINOPHEN 325 MG PO TABS
650.0000 mg | ORAL_TABLET | ORAL | Status: DC | PRN
  Administered 2014-12-28 (×2): 650 mg via ORAL
  Filled 2014-12-28 (×2): qty 2

## 2014-12-28 MED ORDER — DIPHENHYDRAMINE HCL 25 MG PO CAPS: 25.0000 mg | ORAL_CAPSULE | Freq: Four times a day (QID) | ORAL | Status: DC | PRN

## 2014-12-28 MED ORDER — MEDROXYPROGESTERONE ACETATE 150 MG/ML IM SUSP: 150.0000 mg | INTRAMUSCULAR | Status: DC | PRN

## 2014-12-28 MED ORDER — SIMETHICONE 80 MG PO CHEW: 80.0000 mg | CHEWABLE_TABLET | ORAL | Status: DC | PRN

## 2014-12-28 NOTE — Progress Notes (Signed)
Post Partum Day 1 Subjective: no complaints, up ad lib, voiding, tolerating PO and + flatus . Denies HA, blurred vision or RUQ pain. Reports elevated bp end of pregnancy. No meds required Objective: Blood pressure 134/88, pulse 92, temperature 98 F (36.7 C), temperature source Oral, resp. rate 16, height 5\' 6"  (1.676 m), weight 215 lb (97.523 kg), SpO2 99 %, unknown if currently breastfeeding.  Physical Exam:  General: alert and cooperative Lochia: appropriate Uterine Fundus: firm Incision: healing well DVT Evaluation: No evidence of DVT seen on physical exam. Negative Homan's sign. No cords or calf tenderness. No significant calf/ankle edema.   Recent Labs  12/27/14 1140 12/28/14 0745  HGB 13.1 12.3  HCT 39.3 36.1    Assessment/Plan: Plan for discharge tomorrow. Observe bp. Cmp on admission , normal LFT's   LOS: 1 day   Jasraj Lappe G 12/28/2014, 8:20 AM

## 2014-12-28 NOTE — Lactation Note (Signed)
This note was copied from the chart of Vanessa Stone. Lactation Consultation Note  Patient Name: Vanessa Stone Today's Date: 12/28/2014 Reason for consult: Initial assessment Baby 17 hours of life. Called to room by patient's nurse to assist with latching baby. Baby sleepy at breast. Enc mom to undress baby. Baby immediately more alert. Assisted mom to reposition herself and her pillows and demonstrated how to hand express and enc baby to open mouth wide. Mom has colostrum that is easily expressible. Baby latched deeply, suckling rhythmically with a few swallows noted. After nursing well for about 10 minutes, mom accidentally pressed her nipple out of baby's mouth. Mom's nipple is more everted after baby has suckled. Enc mom to compress breast nearer to chest and not near nipple. Enc mom to latch baby again herself, and baby latched deeply suckling rhythmically again. Enc mom to offer lots of STS, nurse with cues and at least 8-12 times/24 hours. [facility], aware of OP/BFSG, community resources, and [facility] phone line assistance after D/C. Enc mom to call for assistance with latching as needed. Discussed assessment and interventions with patient's RN, Dorene GrebeNatalie.  Maternal Data Has patient been taught Hand Expression?: Yes Does the patient have breastfeeding experience prior to this delivery?: No  Feeding Feeding Type: Breast Fed Length of feed:  (LC assessed first 15-20 minutes of BF.)  LATCH Score/Interventions Latch: Grasps breast easily, tongue down, lips flanged, rhythmical sucking. Intervention(s): Adjust position;Assist with latch;Breast compression  Audible Swallowing: A few with stimulation Intervention(s): Skin to skin;Hand expression Intervention(s): Skin to skin;Hand expression  Type of Nipple: Everted at rest and after stimulation  Comfort (Breast/Nipple): Soft / non-tender     Hold (Positioning): Assistance needed to correctly position infant at breast  and maintain latch. Intervention(s): Breastfeeding basics reviewed;Support Pillows;Position options;Skin to skin  LATCH Score: 8  Lactation Tools Discussed/Used Tools: Pump Breast pump type: Manual   Consult Status Consult Status: Follow-up Date: 12/29/14 Follow-up type: In-patient    Geralynn OchsWILLIARD, Jamesa Tedrick 12/28/2014, 3:02 PM

## 2014-12-28 NOTE — Progress Notes (Signed)
CSW acknowledges consult for history of anxiety.   CSW attempted to meet with MOB on two different occassions. First occasion, MOB had numerous visitors. On second attempt, MOB was in the shower.  CSW to complete assessment on 3/24.  

## 2014-12-29 MED ORDER — IBUPROFEN 600 MG PO TABS: 600.0000 mg | ORAL_TABLET | Freq: Four times a day (QID) | ORAL | Status: DC

## 2014-12-29 NOTE — Lactation Note (Signed)
This note was copied from the chart of Vanessa Stone. Lactation Consultation Note  Patient Name: Vanessa Stone Today's Date: 12/29/2014 Reason for consult: Follow-up assessment;Breast/nipple pain;Difficult latch Mom had baby latched on right breast when LC arrived using #24 nipple shield. Baby appears to be in a good suckling pattern but no audible swallows, few swallowing motions noted. No colostrum noted in nipple shield. Mom's right nipple is red, has blisters at the tip. Changed nipple shield back to #20, Mom tolerated the feeding well, baby appeared to have more depth with the 20 nipple shield, but still on scant amount of colostrum at Mom's nipple. Baby acting very hungry. Assisted Mom with latching baby to left breast. The left nipple has more red and clear blisters, tried #20 nipple shield for better fit but PS=7 and Mom could not tolerate. Changed back to size 24 but pain did not improve. With hand expression received few drops of colostrum from each breast. Had Mom pump with manual pump for 15 minutes on left breast but no colostrum received. With oral exam/suck exam LC noted thick labial frenulum and short lingual frenulum, baby will chew intermittently with suck exam. Plan given to Mom for d/c home: Due to left nipple soreness, Mom is going to not breastfeed on the left breast for the next 12-24 hours to let the nipple heal. Mom will BF on the right nipple using the #20 nipple shield. She will BF on demand but at least 8-12 times in 24 hours keeping baby actively nursing for 15-30 minutes. Advised to look for breast milk in the nipple shield with feedings. Post pump both breasts for 15 minutes to encourage milk production, prevent engorgement and protect milk supply. Mom is to supplement with EBM or formula starting with 10 ml today, increasing as needed per guidelines per hours of age.  Once Mom's left nipple feels better, she is to re-introduce left breast with feeding, use #20  nipple shield. BF as stated above but keep baby actively nursing for 15-20 minutes both breasts if possible. Post pump as described above. Continue to supplement until Mom is observing breast milk in the nipple shield and baby is satisfied at the breast.  Demonstrated to parents how to supplement using curved tipped syringe. FOB demonstrated [facility] with this feeding.  Monitor voids/stools.  Lactation OP f/u scheduled for Monday, 01/02/15 at 10:30 am.  Engorgement care reviewed if needed. Advised to refer to page 24 of Baby N Me Booklet, Page 25 for storage guidelines.   Maternal Data    Feeding Feeding Type: Formula Length of feed: 10 min  LATCH Score/Interventions Latch: Grasps breast easily, tongue down, lips flanged, rhythmical sucking. Intervention(s): Adjust position;Assist with latch;Breast massage  Audible Swallowing: A few with stimulation  Type of Nipple: Everted at rest and after stimulation  Comfort (Breast/Nipple): Engorged, cracked, bleeding, large blisters, severe discomfort Problem noted: Cracked, bleeding, blisters, bruises  Problem noted: Severe discomfort Interventions  (Cracked/bleeding/bruising/blister): Expressed breast milk to nipple Interventions (Mild/moderate discomfort): Comfort gels  Hold (Positioning): Assistance needed to correctly position infant at breast and maintain latch. Intervention(s): Breastfeeding basics reviewed;Support Pillows;Position options;Skin to skin  LATCH Score: 6  Lactation Tools Discussed/Used Tools: Shells;Nipple Shields;Pump;Comfort gels Nipple shield size: 20;24 Shell Type: Inverted Breast pump type: Manual   Consult Status Consult Status: Complete Date: 12/29/14 Follow-up type: In-patient    Vanessa Stone, Vanessa Stone 12/29/2014, 12:32 PM

## 2014-12-29 NOTE — Lactation Note (Signed)
This note was copied from the chart of Vanessa Stone. Lactation Consultation Note Mom stated she hasn't been able to latch baby, has no interest in BF earlier. Has been crying for sometime but can't get baby latched. Noted very short shaft nipples, Rt. Nipple compressible and pulls out well. Lt. Nipple w/edema and semi flat and compresses inwards to flat.  Fitted mom w/#16 NS, mom stated feels comfortable. Calmed baby and latched, baby crying and finally started suckling. Mom stated comfort. Baby started gagging at intervals, like was going to spit up. Removed from breast, patted, relatched, took several minutes d/t crying. This happened several times, last time noted baby spit up during BF, pulled off breast noticed blood. Dad suctioned baby, NS removed. Appeared to come from Nipple. Mom stated pain when compressed. Nipple oblong instead of round. #20 NS fitted nipple increased in size during suckling. No further bleeding from Rt. Nipple. Hand expressed colostrum and pat on nipple. No blood noted. Mom stated Rt. Nipple was stinging a little.  Fitted Lt. Side, #16NS fits well, but will try #20. Took a good while to latch baby d/t crying and fighting, yet rooting. Finally got baby to suckle. Mom states comfort during BF. Baby pulled off several times, no bleeding noted.  Shells given to mom to wear in bra in am. Comfort gels given to mom, stated felt great. Noted positional light stripe to Lt. Nipple prior to application of NS.  Explained deep latch verses shallow latch, demonstrated picture in Baby and Me book.  Room temp on 65 when first entered room. Explained to cold for baby. Up temp. To 69 degrees.  Patient Name: Vanessa Stone ZOXWR'UToday's Date: 12/29/2014 Reason for consult: Follow-up assessment;Breast/nipple pain;Difficult latch   Maternal Data    Feeding Feeding Type: Breast Fed Length of feed: 10 min (still BF)  LATCH Score/Interventions Latch: Repeated attempts needed to  sustain latch, nipple held in mouth throughout feeding, stimulation needed to elicit sucking reflex. Intervention(s): Adjust position;Assist with latch;Breast massage;Breast compression  Audible Swallowing: A few with stimulation Intervention(s): Skin to skin;Hand expression Intervention(s): Skin to skin;Hand expression;Alternate breast massage  Type of Nipple: Everted at rest and after stimulation (semi flat, short shaft, Lt. nipple compresses flat)  Comfort (Breast/Nipple): Filling, red/small blisters or bruises, mild/mod discomfort  Problem noted: Cracked, bleeding, blisters, bruises;Mild/Moderate discomfort Interventions  (Cracked/bleeding/bruising/blister): Expressed breast milk to nipple Interventions (Mild/moderate discomfort): Hand massage;Hand expression;Reverse pressue;Comfort gels;Breast shields  Hold (Positioning): Assistance needed to correctly position infant at breast and maintain latch. Intervention(s): Breastfeeding basics reviewed;Support Pillows;Position options;Skin to skin  LATCH Score: 6  Lactation Tools Discussed/Used Tools: Shells;Nipple Shields;Comfort gels Nipple shield size: 16;20 Shell Type: Inverted   Consult Status Consult Status: Follow-up Date: 12/29/14 (in pm) Follow-up type: In-patient    Aidan Moten, Diamond NickelLAURA G 12/29/2014, 1:09 AM

## 2014-12-29 NOTE — Progress Notes (Signed)
Clinical Social Work Department BRIEF PSYCHOSOCIAL ASSESSMENT 12/29/2014  Patient:  Vanessa Stone, Vanessa Stone     Account Number:  [id number]     Admit date:  12/26/2014  Clinical Social Worker:  Lucita Ferrara, CLINICAL SOCIAL WORKER  Date/Time:  12/29/2014 09:15 AM  Referred by:  RN  Date Referred:  12/27/2014  Other Referral:   MOB presents with history of anxiety.   Interview type:  Family Other interview type:    PSYCHOSOCIAL DATA Living Status:  FAMILY Admitted from facility:   Level of care:   Primary support name:  Helyn App Primary support relationship to patient:  PARTNER Degree of support available:   MOB and FOB endorsed strong family support.    CURRENT CONCERNS Current Concerns  None Noted   SOCIAL WORK ASSESSMENT / PLAN CSW met with MOB due to maternal history of anxiety.  MOB and FOB presented as easily engaged and receptive to the visit.  MOB displayed a full range in affect, presented in a pleasant mood, no symptoms of anxiety observed or noted in her thought process.  MOB and FOB expressed excitement as they transition to parenthood, eagerness for discharge, and were observed to be interacting/bonding with the infant.   MOB endorsed strong family support once she returns home, and reported only source of anxiety is difficulties with breastfeeding. She presented with insight on need to have patience since it takes time for both herself and the infant to learn how to breastfeed.  CSW validated and provided supportive listening as she discussed her feelings secondary to breastfeeding.  MOB reported onset of anxiety/panic attacks during college. She stated that she was prescribed medication, but only took it one time since she did not like how it made her feel.  MOB shared that she has since learned how to self-regulate with various coping skills such as deep breathing, utilizing the FOB as support, and problem solving.  She denied any acute anxiety during the pregnancy, and  stated that she only noted an increase in anxiety only prior to being induced.  CSW explored with MOB how various coping skills that she has previously learned may be helpful as she transitions to the postpartum period. CSW provided education on the baby blues and postpartum depression.  MOB denied questions or concerns about information, and agreed to reach out to her medical providers if she notes symptoms in the postpartum period. She stated that she has already noted that she has a hard time sleeping with the infant is sleeping out of fear that something will happen to her if she is sleeping.   Assessment/plan status:  No Further Intervention Required/No barriers to discharge.  Other assessment/ plan:   CSW provided education on the baby blues and postpartum depression.   Information/referral to community resources:   No referrals needed. MOB to follow up with OB or PCP if she notes onset of postpartum depression/anxiety.   PATIENT'S/FAMILY'S RESPONSE TO PLAN OF CARE: MOB and FOB expressed appreciation for the visit.  They denied questions, concerns, or needs.  MOB acknowledged increased risk for developoing postparutm depression given her history, but presents with insight on how to self-regulate.  She expressed confidence in her ability to self-regulate her anxiety, and agreed to contact her medical providers if she onset of postpartum mood disorders

## 2014-12-29 NOTE — Progress Notes (Signed)
Post Partum Day 2 Subjective: up ad lib, voiding, tolerating PO, + flatus and c/o nipple blisters  Objective: Blood pressure 134/82, pulse 83, temperature 98 F (36.7 C), temperature source Oral, resp. rate 20, height 5\' 6"  (1.676 m), weight 215 lb (97.523 kg), SpO2 99 %, unknown if currently breastfeeding.  Physical Exam:  General: alert and cooperative Lochia: appropriate Uterine Fundus: firm Incision: healing well DVT Evaluation: No evidence of DVT seen on physical exam. Negative Homan's sign. No cords or calf tenderness. No significant calf/ankle edema.   Recent Labs  12/27/14 1140 12/28/14 0745  HGB 13.1 12.3  HCT 39.3 36.1    Assessment/Plan: Discharge home  [facility] all purpose nipple cream   LOS: 2 days   CURTIS,CAROL G 12/29/2014, 8:18 AM

## 2014-12-29 NOTE — Discharge Summary (Signed)
Obstetric Discharge Summary Reason for Admission: induction of labor Prenatal Procedures: ultrasound Intrapartum Procedures: spontaneous vaginal delivery Postpartum Procedures: none Complications-Operative and Postpartum: 2 degree perineal laceration HEMOGLOBIN  Date Value Ref Range Status  12/28/2014 12.3 12.0 - 15.0 g/dL Final   HCT  Date Value Ref Range Status  12/28/2014 36.1 36.0 - 46.0 % Final    Physical Exam:  General: alert and cooperative Lochia: appropriate Uterine Fundus: firm Incision: healing well DVT Evaluation: No evidence of DVT seen on physical exam. Negative Homan's sign. No cords or calf tenderness. No significant calf/ankle edema.  Discharge Diagnoses: Term Pregnancy-delivered  Discharge Information: Date: 12/29/2014 Activity: pelvic rest Diet: routine Medications: PNV and Ibuprofen Condition: stable Instructions: refer to practice specific booklet Discharge to: home   Newborn Data: Live born female  Birth Weight: 7 lb 6.5 oz (3359 g) APGAR: 7, 8  Home with mother.  CURTIS,CAROL G 12/29/2014, 8:21 AM

## 2015-01-02 ENCOUNTER — Ambulatory Visit (HOSPITAL_COMMUNITY)
Admit: 2015-01-02 | Discharge: 2015-01-02 | Disposition: A | Discharge status: Home / Self Care | Payer: BLUE CROSS/BLUE SHIELD | Attending: Obstetrics and Gynecology | Admitting: Obstetrics and Gynecology

## 2015-01-03 NOTE — Lactation Note (Signed)
Lactation Consult  Mother's reason for visit: difficult latch, painful, evaluate milk transfer  Consult:  Initial Lactation Consultant:  Omar Personaly, Charrie Mcconnon M  ________________________________________________________________________  Joan FloresBaby's Name: Vanessa Stone Date of Birth: 12/27/2014 Pediatrician: Ermalinda BarriosMark Brassfield, MD Gender: female Gestational Age: 441w1d (At Birth) Birth Weight: 7 lb 6.5 oz (3359 g) Weight at Discharge: Weight: 7 lb 0.5 oz (3190 g)[facility]   12/[facility]14 2106 12/29/14 0033  Weight: 7 lb 6.5 oz (3359 g) 7 lb 0.5 oz (3190 g)   [facility] 15 [facility] Location:Pediatrician's office on 12/31/2014 Weight today: 7 lbs 0.3 oz       ________________________________________________________________________  Mother's Name: Vanessa SartoriusBrittany Stone   Breastfeeding Experience: first baby Maternal Medical Conditions:  Stable post delivery Maternal Medications:  PNV q day  ________________________________________________________________________  Breastfeeding History (Post Discharge)  Frequency of breastfeeding:  Breastfeeding q 2-4 hours Duration of feeding:  20-45 min  Supplementation  Breastmilk:  Volume 45-60 ml Frequency:  Every other feeding, dad feeds baby expressed milk per bottle Total volume per day:   150-22240ml  Method:  Bottle, mother stopped SNS after d/c from the hospital  Infant Intake and Output Assessment  Voids:  7 in 24 hrs.  Color:  Clear yellow Stools:  5 in 24 hrs.  Color:  Yellow  ________________________________________________________________________  Maternal Breast Assessment  Breast:  Full Nipple:  Erect, Reddened, Cracked, Blister and Scabs Pain level:  5 Pain interventions:  Nipple shield                                  Mother has a filled prescription for All Purpose Nipple Cream but has not used it  yet.  _______________________________________________________________________ Feeding Assessment/Evaluation  Initial feeding assessment:  Infant's oral assessment:  WNL, extends tongue with stimulation. Retracts tongue and bites with initial latch. Baby eventually flanges lips and suckles in a rhythmic pattern on the shield with audible swallows. Baby will not latch without the shield. Positioning:  Football, left breast and right breasts    Attached assessment:  Deep  Lips flanged:  Yes.    Lips untucked:  Yes.    Suck assessment:  Nutritive and Displays both  Tools:  Nipple shield 24 mm Instructed on use and cleaning of tool:  Yes.    Pre-feed weight:  3184 g   Post-feed weight:  3258 g  Amount transferred:  74 ml Amount supplemented:  0 ml  Total amount transferred:  74 ml Total supplement given:  0 ml  Attempted latch without the shield but baby is arching and refusing the latch to breast. When efforts to made, baby slips off the breast. She latches eagerly to breast with nipple shield and milk transfer was noted. Breast were very full and nodular on admission. Left breast softened well after the feeding. Right breast softened some but baby sleepy with this feeding. She took 58ml from left breast and 16 ml form right breast. Discussed gradually weaning from shield as baby and mother tolerate. Mother was instructed to pump on discharge form hospital to stimulate milk supply. Mother pumps after each feeding and expressing 45-75 ml. Dad is feeding baby pumped milk between each feeding. Discussed decreasing pumping and feeding baby form the breast. Instructed to change positions and massage breast during feeding for best draining of the breast. Nipple continue to be cracked and sore with inflammation and redness.  Instructed to use nipple cream as prescribed per OB. Apply scant amount following breastfeeding and allow to absorb in the skin. Mother is progressing well with breastfeeding  and becoming independent with latching. Mother will contact [facility] department if needs further assistance. Mom made aware of O/P services, breastfeeding support groups, community resources, and our phone # for post-discharge questions. She will follow up with pediatrician for appointments. Weight and report was called to Pediatrician's nurse today.

## 2015-01-07 NOTE — Anesthesia Postprocedure Evaluation (Signed)
  Anesthesia Post Note  Patient: Vanessa SartoriusBrittany Burnett  Procedure(s) Performed: * No procedures listed *  Anesthesia type: Epidural  Patient location: Mother/Baby  Post pain: Pain level controlled  Post assessment: Post-op Vital signs reviewed  Last Vitals: There were no vitals filed for this visit.  Post vital signs: Reviewed  Level of consciousness: awake  Complications: No apparent anesthesia complications  * per chart review

## 2015-10-13 ENCOUNTER — Emergency Department (HOSPITAL_COMMUNITY)
Admission: EM | Admit: 2015-10-13 | Discharge: 2015-10-13 | Disposition: A | Discharge status: Home / Self Care | Payer: BLUE CROSS/BLUE SHIELD | Attending: Emergency Medicine | Admitting: Emergency Medicine

## 2015-10-13 ENCOUNTER — Encounter (HOSPITAL_COMMUNITY): Payer: Self-pay | Admitting: *Deleted

## 2015-10-13 ENCOUNTER — Emergency Department (HOSPITAL_COMMUNITY): Payer: BLUE CROSS/BLUE SHIELD

## 2015-10-13 DIAGNOSIS — M79601 Pain in right arm: Secondary | ICD-10-CM | POA: Insufficient documentation

## 2015-10-13 DIAGNOSIS — R0602 Shortness of breath: Secondary | ICD-10-CM | POA: Insufficient documentation

## 2015-10-13 DIAGNOSIS — Z3202 Encounter for pregnancy test, result negative: Secondary | ICD-10-CM | POA: Diagnosis not present

## 2015-10-13 DIAGNOSIS — R079 Chest pain, unspecified: Secondary | ICD-10-CM | POA: Diagnosis not present

## 2015-10-13 DIAGNOSIS — Z62810 Personal history of physical and sexual abuse in childhood: Secondary | ICD-10-CM | POA: Insufficient documentation

## 2015-10-13 DIAGNOSIS — M79605 Pain in left leg: Secondary | ICD-10-CM | POA: Insufficient documentation

## 2015-10-13 DIAGNOSIS — Z8719 Personal history of other diseases of the digestive system: Secondary | ICD-10-CM | POA: Diagnosis not present

## 2015-10-13 DIAGNOSIS — Z8659 Personal history of other mental and behavioral disorders: Secondary | ICD-10-CM | POA: Insufficient documentation

## 2015-10-13 DIAGNOSIS — M79602 Pain in left arm: Secondary | ICD-10-CM | POA: Insufficient documentation

## 2015-10-13 DIAGNOSIS — M79604 Pain in right leg: Secondary | ICD-10-CM | POA: Diagnosis not present

## 2015-10-13 DIAGNOSIS — Z880 Allergy status to penicillin: Secondary | ICD-10-CM | POA: Diagnosis not present

## 2015-10-13 DIAGNOSIS — R509 Fever, unspecified: Secondary | ICD-10-CM | POA: Diagnosis not present

## 2015-10-13 DIAGNOSIS — R5383 Other fatigue: Secondary | ICD-10-CM | POA: Diagnosis not present

## 2015-10-13 DIAGNOSIS — M549 Dorsalgia, unspecified: Secondary | ICD-10-CM | POA: Insufficient documentation

## 2015-10-13 LAB — URINALYSIS, ROUTINE W REFLEX MICROSCOPIC
BILIRUBIN URINE: NEGATIVE
Glucose, UA: NEGATIVE mg/dL
KETONES UR: NEGATIVE mg/dL
Leukocytes, UA: NEGATIVE
NITRITE: NEGATIVE
PH: 6.5 (ref 5.0–8.0)
Protein, ur: NEGATIVE mg/dL
SPECIFIC GRAVITY, URINE: 1.02 (ref 1.005–1.030)

## 2015-10-13 LAB — BASIC METABOLIC PANEL
Anion gap: 8 (ref 5–15)
BUN: 10 mg/dL (ref 6–20)
CHLORIDE: 103 mmol/L (ref 101–111)
CO2: 26 mmol/L (ref 22–32)
Calcium: 10.1 mg/dL (ref 8.9–10.3)
Creatinine, Ser: 0.66 mg/dL (ref 0.44–1.00)
Glucose, Bld: 112 mg/dL — ABNORMAL HIGH (ref 65–99)
POTASSIUM: 4.1 mmol/L (ref 3.5–5.1)
SODIUM: 137 mmol/L (ref 135–145)

## 2015-10-13 LAB — CBC WITH DIFFERENTIAL/PLATELET
BASOS ABS: 0 10*3/uL (ref 0.0–0.1)
Basophils Relative: 0 %
EOS ABS: 0 10*3/uL (ref 0.0–0.7)
Eosinophils Relative: 0 %
HCT: 44.3 % (ref 36.0–46.0)
HEMOGLOBIN: 14.8 g/dL (ref 12.0–15.0)
Lymphocytes Relative: 5 %
Lymphs Abs: 1.6 10*3/uL (ref 0.7–4.0)
MCH: 29.1 pg (ref 26.0–34.0)
MCHC: 33.4 g/dL (ref 30.0–36.0)
MCV: 87 fL (ref 78.0–100.0)
MONO ABS: 0.9 10*3/uL (ref 0.1–1.0)
Monocytes Relative: 3 %
NEUTROS PCT: 92 %
Neutro Abs: 28.9 10*3/uL — ABNORMAL HIGH (ref 1.7–7.7)
PLATELETS: 206 10*3/uL (ref 150–400)
RBC: 5.09 MIL/uL (ref 3.87–5.11)
RDW: 12.9 % (ref 11.5–15.5)
WBC: 31.4 10*3/uL — AB (ref 4.0–10.5)

## 2015-10-13 LAB — URINE MICROSCOPIC-ADD ON

## 2015-10-13 LAB — I-STAT CG4 LACTIC ACID, ED: LACTIC ACID, VENOUS: 2.13 mmol/L — AB (ref 0.5–2.0)

## 2015-10-13 LAB — SEDIMENTATION RATE: Sed Rate: 6 mm/hr (ref 0–22)

## 2015-10-13 LAB — PREGNANCY, URINE: PREG TEST UR: NEGATIVE

## 2015-10-13 LAB — C-REACTIVE PROTEIN: CRP: 6.4 mg/dL — AB (ref <1.0)

## 2015-10-13 MED ORDER — KETOROLAC TROMETHAMINE 30 MG/ML IJ SOLN
30.0000 mg | Freq: Once | INTRAMUSCULAR | Status: AC
  Administered 2015-10-13: 30 mg via INTRAVENOUS
  Filled 2015-10-13: qty 1

## 2015-10-13 MED ORDER — ALBUTEROL SULFATE (2.5 MG/3ML) 0.083% IN NEBU
2.5000 mg | INHALATION_SOLUTION | RESPIRATORY_TRACT | Status: DC | PRN
  Administered 2015-10-13: 2.5 mg via RESPIRATORY_TRACT
  Filled 2015-10-13: qty 3

## 2015-10-13 MED ORDER — SODIUM CHLORIDE 0.9 % IV BOLUS (SEPSIS)
1000.0000 mL | Freq: Once | INTRAVENOUS | Status: AC
  Administered 2015-10-13: 1000 mL via INTRAVENOUS
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
## 2015-10-13 NOTE — ED Notes (Signed)
Pt reports having episodes over last few months of fever,bodyaches, cough and chest pain. ekg done at triage, no resp distress noted.

## 2015-10-13 NOTE — Discharge Instructions (Signed)
You will be contacted by Selena Batten, the infectious disease Drs[facility] Environmental health practitioner to schedule you a specific follow-up time. Return to the ER if you have recurrence of your symptoms in the interval.    Fever, Adult A fever is an increase in the body's temperature. It is often defined as a temperature of 100 F (38C) or higher. Short mild or moderate fevers often have no long-term effects. They also often do not need treatment. Moderate or high fevers may make you feel uncomfortable. Sometimes, they can also be a sign of a serious illness or disease. The sweating that may happen with repeated fevers or fevers that last a while may also cause you to not have enough fluid in your body (dehydration). You can take your temperature with a thermometer to see if you have a fever. A measured temperature can change with:  Age.  Time of day.  Where the thermometer is placed:  Mouth (oral).  Rectum (rectal).  Ear (tympanic).  Underarm (axillary).  Forehead (temporal). HOME CARE Pay attention to any changes in your symptoms. Take these actions to help with your condition:  Take over-the-counter and prescription medicines only as told by your doctor. Follow the dosing instructions carefully.  If you were prescribed an antibiotic medicine, take it as told by your doctor. Do not stop taking the antibiotic even if you start to feel better.  Rest as needed.  Drink enough fluid to keep your pee (urine) clear or pale yellow.  Sponge yourself or bathe with room-temperature water as needed. This helps to lower your body temperature . Do not use ice water.  Do not wear too many blankets or heavy clothes. GET HELP IF:  You throw up (vomit).  You cannot eat or drink without throwing up.  You have watery poop (diarrhea).  It hurts when you pee.  Your symptoms do not get better with treatment.  You have new symptoms.  You feel very weak. GET HELP RIGHT AWAY IF:  You are short of breath  or have trouble breathing.  You are dizzy or you pass out (faint).  You feel confused.  You have signs of not having enough fluid in your body, such as:  A dry mouth.  Peeing less.  Looking pale.  You have very bad pain in your belly (abdomen).  You keep throwing up or having water poop.  You have a skin rash.  Your symptoms suddenly get worse.   This information is not intended to replace advice given to you by your health care provider. Make sure you discuss any questions you have with your health care provider.   Document Released: 07/02/2008 Document Revised: 06/14/2015 Document Reviewed: 11/17/2014 Elsevier Interactive Patient Education 2016 [employer].  Blood Culture Test WHY AM I HAVING THIS TEST? A blood culture test is performed to see if you have an infection in your blood (septicemia). Septicemia could be caused by bacteria, fungi, or viruses. Normally, blood is free of bacteria, fungi, and viruses. This test may be ordered if you have symptoms of septicemia. These symptoms may include fever, chills, nausea, and fatigue. WHAT KIND OF SAMPLE IS TAKEN? At least two blood samples from two different veins are required for this test. The blood samples are usually collected by inserting a needle into a vein. This is done because:  There is a better chance of finding the infection with multiple samples.  Sometimes, despite disinfection of the skin where the blood is collected, you can grow a skin  contaminant. This will result in a positive blood culture. This is called a false-positive. With multiple samples, there is a better chance of ruling out a false-positive. HOW DO I PREPARE FOR THE TEST? It is preferred to have the blood samples performed before starting antibiotic medicine. Tell your health care provider if you are currently taking an antibiotic. If blood cultures are performed while you are on an antibiotic, the blood samples should be performed shortly before  you take a dose of antibiotic. HOW ARE YOUR TEST RESULTS REPORTED? Your test results will be reported as either positive or negative. It is your responsibility to obtain your test results. Ask the lab or department performing the test when and how you will get your results. A false-positive result can occur. A false-positive result is incorrect because it indicates a condition or finding is present when it is not. A false-negative result can occur. A false-negative result is incorrect because it indicates a condition or finding is not present when it is. WHAT DO THE RESULTS MEAN? A positive blood test may mean that you have septicemia. Talk with your health care provider to discuss your results, treatment options, and if necessary, the need for more tests. Talk with your health care provider if you have any questions about your results.   This information is not intended to replace advice given to you by your health care provider. Make sure you discuss any questions you have with your health care provider.   Document Released: 10/16/2004 Document Revised: 10/14/2014 Document Reviewed: 02/28/2014 Elsevier Interactive Patient Education [employer] Elsevier Inc.

## 2015-10-13 NOTE — ED Notes (Signed)
EDP at bedside  

## 2015-10-16 ENCOUNTER — Telehealth (HOSPITAL_BASED_OUTPATIENT_CLINIC_OR_DEPARTMENT_OTHER): Payer: Self-pay | Admitting: Emergency Medicine

## 2015-10-17 ENCOUNTER — Telehealth (HOSPITAL_COMMUNITY): Payer: Self-pay

## 2015-10-17 NOTE — Telephone Encounter (Signed)
Pt calling for blood cx results.  Informed pt Prelim reports to date show no growth.

## 2015-10-18 ENCOUNTER — Ambulatory Visit (INDEPENDENT_AMBULATORY_CARE_PROVIDER_SITE_OTHER): Payer: BLUE CROSS/BLUE SHIELD | Admitting: Infectious Disease

## 2015-10-18 ENCOUNTER — Encounter: Payer: Self-pay | Admitting: Infectious Disease

## 2015-10-18 VITALS — BP 122/82 | HR 71 | Temp 98.7°F | Wt 176.0 lb

## 2015-10-18 DIAGNOSIS — R6510 Systemic inflammatory response syndrome (SIRS) of non-infectious origin without acute organ dysfunction: Secondary | ICD-10-CM | POA: Diagnosis not present

## 2015-10-18 DIAGNOSIS — R509 Fever, unspecified: Secondary | ICD-10-CM

## 2015-10-18 DIAGNOSIS — K589 Irritable bowel syndrome without diarrhea: Secondary | ICD-10-CM

## 2015-10-18 DIAGNOSIS — A483 Toxic shock syndrome: Secondary | ICD-10-CM

## 2015-10-18 HISTORY — DX: Fever, unspecified: R50.9

## 2015-10-18 HISTORY — DX: Toxic shock syndrome: A48.3

## 2015-10-18 HISTORY — DX: Irritable bowel syndrome, unspecified: K58.9

## 2015-10-18 LAB — POCT URINE PREGNANCY: Preg Test, Ur: NEGATIVE

## 2015-10-18 LAB — CULTURE, BLOOD (ROUTINE X 2)
CULTURE: NO GROWTH
CULTURE: NO GROWTH

## 2015-10-18 LAB — CK: Total CK: 65 U/L (ref 7–177)

## 2015-10-18 LAB — C-REACTIVE PROTEIN: CRP: 0.7 mg/dL — ABNORMAL HIGH (ref <0.60)

## 2015-10-18 LAB — RHEUMATOID FACTOR
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
## 2015-10-19 LAB — EPSTEIN-BARR VIRUS EARLY D ANTIGEN ANTIBODY, IGG: EBV EA IgG: 12.1 U/mL — ABNORMAL HIGH (ref <9.0)

## 2015-10-19 LAB — SJOGRENS SYNDROME-B EXTRACTABLE NUCLEAR ANTIBODY: SSB (LA) (ENA) ANTIBODY, IGG: NEGATIVE

## 2015-10-19 LAB — HEPATITIS PANEL, ACUTE
HCV AB: NEGATIVE
HEP A IGM: NONREACTIVE
HEP B S AG: NEGATIVE
Hep B C IgM: NONREACTIVE

## 2015-10-19 LAB — EPSTEIN-BARR VIRUS VCA ANTIBODY PANEL
EBV EA IgG: 12.1 U/mL — ABNORMAL HIGH (ref <9.0)
EBV NA IgG: >600 U/mL — ABNORMAL HIGH (ref <18.0)
EBV VCA IgG: 269 U/mL — ABNORMAL HIGH (ref <18.0)
EBV VCA IgM: 19.3 U/mL (ref <36.0)

## 2015-10-19 LAB — SJOGRENS SYNDROME-A EXTRACTABLE NUCLEAR ANTIBODY: SSA (Ro) (ENA) Antibody, IgG: <1

## 2015-10-19 LAB — ANTI-NUCLEAR AB-TITER (ANA TITER)

## 2015-10-19 LAB — SEDIMENTATION RATE: SED RATE: 20 mm/h (ref 0–20)

## 2015-10-19 LAB — ANA: ANA: POSITIVE — AB

## 2015-10-19 LAB — FERRITIN: FERRITIN: 112 ng/mL (ref 10–291)

## 2015-10-19 LAB — TSH: TSH: 1.141 u[IU]/mL (ref 0.350–4.500)

## 2015-10-19 LAB — ANGIOTENSIN CONVERTING ENZYME: Angiotensin-Converting Enzyme: 23 U/L (ref 8–52)

## 2015-10-19 LAB — HIV ANTIBODY (ROUTINE TESTING W REFLEX): HIV: NONREACTIVE

## 2015-10-19 LAB — EPSTEIN-BARR VIRUS NUCLEAR ANTIGEN ANTIBODY, IGG

## 2015-10-20 ENCOUNTER — Telehealth: Payer: Self-pay | Admitting: *Deleted

## 2015-10-20 LAB — PROTEIN ELECTROPHORESIS, SERUM
Albumin ELP: 4.2 g/dL (ref 3.8–4.8)
Alpha-1-Globulin: 0.4 g/dL — ABNORMAL HIGH (ref 0.2–0.3)
Alpha-2-Globulin: 1 g/dL — ABNORMAL HIGH (ref 0.5–0.9)
Beta 2: 0.4 g/dL (ref 0.2–0.5)
Beta Globulin: 0.4 g/dL (ref 0.4–0.6)
GAMMA GLOBULIN: 1.1 g/dL (ref 0.8–1.7)
TOTAL PROTEIN, SERUM ELECTROPHOR: 7.4 g/dL (ref 6.1–8.1)

## 2015-10-20 LAB — CMV IGM: CMV IgM: <8 AU/mL (ref <30.00)

## 2015-10-20 LAB — CYTOMEGALOVIRUS ANTIBODY, IGG

## 2015-10-20 NOTE — Telephone Encounter (Signed)
Patient called asking if her results are ready.  Please advise on results and interpretation - or contact patient directly.  She was seen 1/11 for Fever of Unknown Origin. Andree CossHowell, Ranette Luckadoo M, RN

## 2015-10-23 NOTE — Telephone Encounter (Signed)
Relayed results -- thanks!

## 2015-10-23 NOTE — Telephone Encounter (Signed)
Thank you :)

## 2015-10-23 NOTE — Telephone Encounter (Signed)
There is not much back to tell us anything at this point she has a non specific elevation in her ANA the remainder ofh er labs were fairly unremarkable and do not point us to any specific diagnosis. 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

## 2015-10-24 LAB — HLA B*5701: HLA-B 5701 W/RFLX HLA-B HIGH: NEGATIVE

## 2015-10-25 ENCOUNTER — Encounter: Payer: Self-pay | Admitting: Infectious Disease

## 2015-10-26 ENCOUNTER — Encounter: Payer: Self-pay | Admitting: *Deleted

## 2015-11-01 ENCOUNTER — Ambulatory Visit (HOSPITAL_COMMUNITY)
Admission: RE | Admit: 2015-11-01 | Discharge: 2015-11-01 | Disposition: A | Discharge status: Home / Self Care | Payer: BLUE CROSS/BLUE SHIELD | Source: Ambulatory Visit | Attending: Infectious Disease | Admitting: Infectious Disease

## 2015-11-01 DIAGNOSIS — R509 Fever, unspecified: Secondary | ICD-10-CM | POA: Diagnosis present

## 2015-11-01 DIAGNOSIS — R0602 Shortness of breath: Secondary | ICD-10-CM | POA: Diagnosis not present

## 2015-11-01 DIAGNOSIS — D72829 Elevated white blood cell count, unspecified: Secondary | ICD-10-CM | POA: Diagnosis not present

## 2015-11-01 DIAGNOSIS — M545 Low back pain: Secondary | ICD-10-CM | POA: Diagnosis not present

## 2015-11-01 MED ORDER — IOHEXOL 300 MG/ML  SOLN
100.0000 mL | Freq: Once | INTRAMUSCULAR | Status: AC | PRN
  Administered 2015-11-01: 100 mL via INTRAVENOUS

## 2015-11-02 ENCOUNTER — Telehealth: Payer: Self-pay | Admitting: *Deleted

## 2015-11-02 NOTE — Telephone Encounter (Signed)
Patient calling for CT results, asking for next step. Please advise. Andree Coss, RN

## 2015-11-03 ENCOUNTER — Telehealth: Payer: Self-pay | Admitting: Infectious Disease

## 2015-11-03 NOTE — Telephone Encounter (Signed)
I will call her next step is her appt. Nothing has shown up in labs or imaging with [place]

## 2015-11-03 NOTE — Telephone Encounter (Signed)
Patient's CTscans labs are unrevealing for cause of her symptoms.   I left message on her mobile device re this.  She has FU appt with me in March. I advised that she call when her symptoms return and we can try to work her into clinic at that point if those symptoms recur before her appt with me

## 2015-11-06 ENCOUNTER — Encounter: Payer: Self-pay | Admitting: Infectious Disease

## 2015-11-06 ENCOUNTER — Ambulatory Visit: Payer: BLUE CROSS/BLUE SHIELD | Admitting: Infectious Disease

## 2015-11-08 ENCOUNTER — Encounter: Payer: Self-pay | Admitting: Infectious Disease

## 2015-12-20 ENCOUNTER — Ambulatory Visit: Payer: BLUE CROSS/BLUE SHIELD | Admitting: Infectious Disease

## 2016-02-15 ENCOUNTER — Encounter (HOSPITAL_COMMUNITY): Payer: Self-pay | Admitting: *Deleted

## 2016-02-15 NOTE — H&P (Addendum)
  Vanessa Stone S:  Vanessa Stone presents today for ultrasound evaluation and pregnancy confirmation.  Vanessa Stone has been having issues with low-grade fever, previously was having problems with breast-feeding and recurrent mastitis and a temperature she states up to 101 to 102.  We had wrote her for some antibiotics, and she never did start that.  Does have a last menstrual period of 12/18/2015 giving a 12/18 EDC with 8 weeks' pregnant.   O:  The ultrasound today shows a 6 week 3 day viable fetus.  The fetus measuring 6 weeks 3 days, but the heartbeat is 76 beats/minute.   A/P:  Size less than dates with a slow heartbeat.  Discussed probable nonviable pregnancy.  Patient of course is distraught.  We are going to have her follow up in 3 days for a repeat ultrasound.  Discussed I would like her to go ahead and start the Keflex and if she is continuing to get intermittent fevers, which I think are related to the breast, would recommend either mammogram or at least ultrasound of the breast to rule out abscess.  02/15/16 US today shows no fetal heart activity and no change in gest sac size  Imp: Plan [place] demise at 6.5 weeks.  Pt desires D&E.  R&B discussed and informed consent obtained.  Blood type O+  DL  1/61/095/12/17 [id number] This patient has been seen and examined.   All of her questions were answered.  Labs and vital signs reviewed.  Informed consent has been obtained.  The History and Physical is current. DL

## 2016-02-16 ENCOUNTER — Encounter (HOSPITAL_COMMUNITY): Payer: Self-pay | Admitting: *Deleted

## 2016-02-16 ENCOUNTER — Ambulatory Visit (HOSPITAL_COMMUNITY): Payer: BLUE CROSS/BLUE SHIELD | Admitting: Anesthesiology

## 2016-02-16 ENCOUNTER — Encounter (HOSPITAL_COMMUNITY)
Admission: RE | Disposition: A | Discharge status: Home / Self Care | Payer: Self-pay | Source: Ambulatory Visit | Attending: Obstetrics and Gynecology

## 2016-02-16 ENCOUNTER — Ambulatory Visit (HOSPITAL_COMMUNITY)
Admission: RE | Admit: 2016-02-16 | Discharge: 2016-02-16 | Disposition: A | Discharge status: Home / Self Care | Payer: BLUE CROSS/BLUE SHIELD | Source: Ambulatory Visit | Attending: Obstetrics and Gynecology | Admitting: Obstetrics and Gynecology

## 2016-02-16 DIAGNOSIS — Z3A01 Less than 8 weeks gestation of pregnancy: Secondary | ICD-10-CM | POA: Insufficient documentation

## 2016-02-16 DIAGNOSIS — O021 Missed abortion: Secondary | ICD-10-CM | POA: Insufficient documentation

## 2016-02-16 HISTORY — PX: DILATION AND EVACUATION: SHX1459

## 2016-02-16 HISTORY — DX: Gastro-esophageal reflux disease without esophagitis: K21.9

## 2016-02-16 LAB — CBC
HCT: 40.3 % (ref 36.0–46.0)
Hemoglobin: 13.5 g/dL (ref 12.0–15.0)
MCH: 28.4 pg (ref 26.0–34.0)
MCHC: 33.5 g/dL (ref 30.0–36.0)
MCV: 84.8 fL (ref 78.0–100.0)
PLATELETS: 229 10*3/uL (ref 150–400)
RBC: 4.75 MIL/uL (ref 3.87–5.11)
RDW: 13.7 % (ref 11.5–15.5)
WBC: 14.2 10*3/uL — AB (ref 4.0–10.5)

## 2016-02-16 SURGERY — DILATION AND EVACUATION, UTERUS
Anesthesia: Monitor Anesthesia Care

## 2016-02-16 MED ORDER — ONDANSETRON HCL 4 MG/2ML IJ SOLN
INTRAMUSCULAR | Status: DC | PRN
  Administered 2016-02-16: 4 mg via INTRAVENOUS

## 2016-02-16 MED ORDER — ONDANSETRON HCL 4 MG/2ML IJ SOLN
INTRAMUSCULAR | Status: AC
  Filled 2016-02-16: qty 2

## 2016-02-16 MED ORDER — LIDOCAINE-EPINEPHRINE 1 %-1:100000 IJ SOLN
INTRAMUSCULAR | Status: DC | PRN
  Administered 2016-02-16: 20 mL

## 2016-02-16 MED ORDER — LIDOCAINE HCL (CARDIAC) 20 MG/ML IV SOLN
INTRAVENOUS | Status: AC
  Filled 2016-02-16: qty 5

## 2016-02-16 MED ORDER — PROPOFOL 10 MG/ML IV BOLUS
INTRAVENOUS | Status: DC | PRN
  Administered 2016-02-16 (×2): 30 mg via INTRAVENOUS
  Administered 2016-02-16: 20 mg via INTRAVENOUS
  Administered 2016-02-16 (×4): 30 mg via INTRAVENOUS
  Administered 2016-02-16: 20 mg via INTRAVENOUS

## 2016-02-16 MED ORDER — KETOROLAC TROMETHAMINE 30 MG/ML IJ SOLN
INTRAMUSCULAR | Status: DC | PRN
  Administered 2016-02-16: 30 mg via INTRAVENOUS

## 2016-02-16 MED ORDER — MIDAZOLAM HCL 5 MG/5ML IJ SOLN
INTRAMUSCULAR | Status: DC | PRN
  Administered 2016-02-16: 2 mg via INTRAVENOUS

## 2016-02-16 MED ORDER — LIDOCAINE HCL (CARDIAC) 20 MG/ML IV SOLN
INTRAVENOUS | Status: DC | PRN
Start: 2016-02-16 — End: 2016-02-16
  Administered 2016-02-16: 50 mg via INTRAVENOUS

## 2016-02-16 MED ORDER — METHYLERGONOVINE MALEATE 0.2 MG/ML IJ SOLN
INTRAMUSCULAR | Status: AC
  Filled 2016-02-16: qty 1

## 2016-02-16 MED ORDER — MIDAZOLAM HCL 2 MG/2ML IJ SOLN
INTRAMUSCULAR | Status: AC
  Filled 2016-02-16: qty 2

## 2016-02-16 MED ORDER — FENTANYL CITRATE (PF) 100 MCG/2ML IJ SOLN: 25.0000 ug | INTRAMUSCULAR | Status: DC | PRN

## 2016-02-16 MED ORDER — PROPOFOL 10 MG/ML IV BOLUS
INTRAVENOUS | Status: AC
  Filled 2016-02-16: qty 20

## 2016-02-16 MED ORDER — FENTANYL CITRATE (PF) 100 MCG/2ML IJ SOLN
INTRAMUSCULAR | Status: AC
  Filled 2016-02-16: qty 2

## 2016-02-16 MED ORDER — METHYLERGONOVINE MALEATE 0.2 MG/ML IJ SOLN
INTRAMUSCULAR | Status: DC | PRN
  Administered 2016-02-16: 0.2 mg via INTRAMUSCULAR

## 2016-02-16 MED ORDER — SCOPOLAMINE 1 MG/3DAYS TD PT72
MEDICATED_PATCH | TRANSDERMAL | Status: AC
  Administered 2016-02-16: 1.5 mg via TRANSDERMAL
  Filled 2016-02-16: qty 1

## 2016-02-16 MED ORDER — DEXAMETHASONE SODIUM PHOSPHATE 4 MG/ML IJ SOLN
INTRAMUSCULAR | Status: AC
  Filled 2016-02-16: qty 1

## 2016-02-16 MED ORDER — METOCLOPRAMIDE HCL 5 MG/ML IJ SOLN: 10.0000 mg | Freq: Once | INTRAMUSCULAR | Status: DC | PRN

## 2016-02-16 MED ORDER — DEXTROSE 5 % IV SOLN
INTRAVENOUS | Status: AC
  Administered 2016-02-16: 114.5 mL via INTRAVENOUS
  Filled 2016-02-16: qty 8.5

## 2016-02-16 MED ORDER — LACTATED RINGERS IV SOLN
INTRAVENOUS | Status: DC
  Administered 2016-02-16: 14:00:00 via INTRAVENOUS

## 2016-02-16 MED ORDER — LIDOCAINE-EPINEPHRINE 1 %-1:100000 IJ SOLN
INTRAMUSCULAR | Status: AC
  Filled 2016-02-16: qty 1

## 2016-02-16 MED ORDER — SCOPOLAMINE 1 MG/3DAYS TD PT72
1.0000 | MEDICATED_PATCH | Freq: Once | TRANSDERMAL | Status: DC
  Administered 2016-02-16: 1.5 mg via TRANSDERMAL

## 2016-02-16 MED ORDER — MEPERIDINE HCL 25 MG/ML IJ SOLN: 6.2500 mg | INTRAMUSCULAR | Status: DC | PRN

## 2016-02-16 MED ORDER — FENTANYL CITRATE (PF) 100 MCG/2ML IJ SOLN
INTRAMUSCULAR | Status: DC | PRN
  Administered 2016-02-16: 50 ug via INTRAVENOUS

## 2016-02-16 MED ORDER — DEXAMETHASONE SODIUM PHOSPHATE 4 MG/ML IJ SOLN
INTRAMUSCULAR | Status: DC | PRN
  Administered 2016-02-16: 4 mg via INTRAVENOUS

## 2016-02-16 MED ORDER — LACTATED RINGERS IV SOLN: INTRAVENOUS | Status: DC

## 2016-02-16 SURGICAL SUPPLY — 19 items
CATH ROBINSON RED A/P 16FR (CATHETERS) ×3 IMPLANT
CLOTH BEACON ORANGE TIMEOUT ST (SAFETY) ×3 IMPLANT
DECANTER SPIKE VIAL GLASS SM (MISCELLANEOUS) ×3 IMPLANT
GLOVE BIO SURGEON STRL SZ8 (GLOVE) ×3 IMPLANT
GLOVE BIOGEL PI IND STRL 7.0 (GLOVE) ×1 IMPLANT
GLOVE BIOGEL PI INDICATOR 7.0 (GLOVE) ×2
GLOVE SURG ORTHO 8.0 STRL STRW (GLOVE) ×3 IMPLANT
GOWN STRL REUS W/TWL LRG LVL3 (GOWN DISPOSABLE) ×6 IMPLANT
KIT BERKELEY 1ST TRIMESTER 3/8 (MISCELLANEOUS) ×3 IMPLANT
NS IRRIG 1000ML POUR BTL (IV SOLUTION) ×3 IMPLANT
PACK VAGINAL MINOR WOMEN LF (CUSTOM PROCEDURE TRAY) ×3 IMPLANT
PAD OB MATERNITY 4.3X12.25 (PERSONAL CARE ITEMS) ×3 IMPLANT
PAD PREP 24X48 CUFFED NSTRL (MISCELLANEOUS) ×3 IMPLANT
SET BERKELEY SUCTION TUBING (SUCTIONS) ×3 IMPLANT
TOWEL OR 17X24 6PK STRL BLUE (TOWEL DISPOSABLE) ×6 IMPLANT
VACURETTE 10 RIGID CVD (CANNULA) IMPLANT
VACURETTE 7MM CVD STRL WRAP (CANNULA) ×3 IMPLANT
VACURETTE 8 RIGID CVD (CANNULA) IMPLANT
VACURETTE 9 RIGID CVD (CANNULA) IMPLANT

## 2016-02-16 NOTE — Discharge Instructions (Signed)

## 2016-02-16 NOTE — Brief Op Note (Signed)
02/16/2016  2:46 PM  PATIENT:  Vanessa Stone  25 y.o. female  PRE-OPERATIVE DIAGNOSIS:  missed AB  POST-OPERATIVE DIAGNOSIS:  * No post-op diagnosis entered *  PROCEDURE:  Procedure(s): DILATATION AND EVACUATION (N/A)  SURGEON:  Surgeon(s) and Role:    * Candice Campavid Emeka Lindner, MD - Primary  PHYSICIAN ASSISTANT:   ASSISTANTS: none   ANESTHESIA:   local and MAC  EBL:  Total I/O In: -  Out: 100 [Urine:100]  BLOOD ADMINISTERED:none  DRAINS: none   LOCAL MEDICATIONS USED:  LIDOCAINE   SPECIMEN:  Source of Specimen:  POC  DISPOSITION OF SPECIMEN:  PATHOLOGY  COUNTS:  YES  TOURNIQUET:  * No tourniquets in log *  DICTATION: .Other Dictation: Dictation Number 1  PLAN OF CARE: Discharge to home after PACU  PATIENT DISPOSITION:  PACU - hemodynamically stable.   Delay start of Pharmacological VTE agent (>24hrs) due to surgical blood loss or risk of bleeding: not applicable

## 2016-02-16 NOTE — Transfer of Care (Signed)
Immediate Anesthesia Transfer of Care Note  Patient: Vanessa Stone  Procedure(s) Performed: Procedure(s): DILATATION AND EVACUATION (N/A)  Patient Location: PACU  Anesthesia Type:MAC  Level of Consciousness: awake, alert  and oriented  Airway & Oxygen Therapy: Patient Spontanous Breathing and Patient connected to nasal cannula oxygen  Post-op Assessment: Report given to RN  Post vital signs: Reviewed  Last Vitals:  Filed Vitals:   02/16/16 1319  BP: 129/82  Pulse: 78  Temp: 36.6 C  Resp: 20    Last Pain: There were no vitals filed for this visit.    Patients Stated Pain Goal: 3 (02/16/16 1319)  Complications: No apparent anesthesia complications

## 2016-02-16 NOTE — Anesthesia Preprocedure Evaluation (Signed)
Anesthesia Evaluation  Patient identified by MRN, date of birth, ID band Patient awake    Reviewed: Allergy & Precautions, NPO status , Patient's Chart, lab work & pertinent test results  Airway Mallampati: II  TM Distance: >3 FB Neck ROM: Full    Dental no notable dental hx.    Pulmonary neg pulmonary ROS,    Pulmonary exam normal breath sounds clear to auscultation       Cardiovascular negative cardio ROS Normal cardiovascular exam Rhythm:Regular Rate:Normal     Neuro/Psych negative neurological ROS  negative psych ROS   GI/Hepatic negative GI ROS, Neg liver ROS,   Endo/Other  negative endocrine ROS  Renal/GU negative Renal ROS  negative genitourinary   Musculoskeletal negative musculoskeletal ROS (+)   Abdominal   Peds negative pediatric ROS (+)  Hematology negative hematology ROS (+)   Anesthesia Other Findings   Reproductive/Obstetrics negative OB ROS                             Anesthesia Physical Anesthesia Plan  ASA: II  Anesthesia Plan: MAC   Post-op Pain Management:    Induction: Intravenous  Airway Management Planned: Natural Airway  Additional Equipment:   Intra-op Plan:   Post-operative Plan:   Informed Consent: I have reviewed the patients History and Physical, chart, labs and discussed the procedure including the risks, benefits and alternatives for the proposed anesthesia with the patient or authorized representative who has indicated his/her understanding and acceptance.   Dental advisory given  Plan Discussed with: CRNA  Anesthesia Plan Comments:         Anesthesia Quick Evaluation  

## 2016-02-16 NOTE — Anesthesia Postprocedure Evaluation (Signed)
Anesthesia Post Note  Patient: Vanessa Stone  Procedure(s) Performed: Procedure(s) (LRB): DILATATION AND EVACUATION (N/A)  Patient location during evaluation: PACU Anesthesia Type: MAC Level of consciousness: awake and alert Pain management: pain level controlled Vital Signs Assessment: post-procedure vital signs reviewed and stable Respiratory status: spontaneous breathing, nonlabored ventilation, respiratory function stable and patient connected to nasal cannula oxygen Cardiovascular status: stable and blood pressure returned to baseline Anesthetic complications: no     Last Vitals:  Filed Vitals:   02/16/16 1319 02/16/16 1500  BP: 129/82 116/76  Pulse: 78 97  Temp: 36.6 C 37.1 C  Resp: 20 22    Last Pain: There were no vitals filed for this visit. Pain Goal: Patients Stated Pain Goal: 3 (02/16/16 1319)               Phillips Groutarignan, Tomorrow Dehaas

## 2016-02-17 NOTE — Op Note (Signed)
NAMSampson Goon:  Stone, Vanessa              ACCOUNT NO.:  [id number]  MEDICAL RECORD NO.:  [id number]  LOCATION:  [facility]                          FACILITY:  [facility]  PHYSICIAN:  Dineen Kidavid C. Rana SnareLowe, M.D.    DATE OF BIRTH:  Sep 01, 1991  DATE OF PROCEDURE:  02/16/2016 DATE OF DISCHARGE:  02/16/2016                              OPERATIVE REPORT   PREOPERATIVE DIAGNOSIS:  Missed abortion 6-1/[redacted] weeks gestational size.  POSTOPERATIVE DIAGNOSIS:  Missed abortion 6-1/[redacted] weeks gestational size.  PROCEDURE:  Dilation and evacuation.  SURGEON:  Dineen Kidavid C. Rana SnareLowe, MD  ANESTHESIA:  Monitored anesthetic care and paracervical block.  INDICATIONS:  Ms. Vanessa Stone is a 25 year old, with an ultrasound yesterday showing a 6-1/[redacted] week gestational sac and a fetal embryonic demise.  The patient desires definitive surgical intervention and requests dilation and evacuation.  Risks and benefits were discussed at length.  Informed consent was obtained.  She is O positive.  DESCRIPTION OF PROCEDURE:  After adequate analgesia, the patient was placed in the dorsal lithotomy position, sterilely prepped and draped. Bladder sterilely drained.  Graves speculum was placed.  Tenaculum was placed on the anterior lip of the cervix.  A paracervical block was placed with 1% Xylocaine 1:100,000 epinephrine.  Total of 20 mL used. Uterus was sounded to 9 cm, easily dilated to a #23 Pratt dilator.  A 7 mm suction curette was inserted.  Suction curettage was performed until a gritty surface was felt throughout the endometrial cavity and no further products of conception being retrieved.  The patient was given 0.2 mg of Methergine IM with good uterine response.  The curette was removed.  Tenaculum was removed from the cervix, noted to be hemostatic. The patient was then transferred to recovery room in stable condition. Sponge and instrument count was normal x3.  Estimated blood loss was minimal.  The patient received gentamicin and  clindamycin preoperatively, Methergine 0.2 mg IM intraoperatively and Toradol 30 mg IV intraoperatively.  DISPOSITION:  Patient will be discharged home and will follow up in the office in 2-3 weeks.  Sent home with the routine instruction sheet for dilation and evacuation.     Dineen Kidavid C. Rana SnareLowe, M.D.     DCL/MEDQ  D:  02/16/2016  T:  02/17/2016  Job:  [id number]

## 2016-02-19 ENCOUNTER — Encounter (HOSPITAL_COMMUNITY): Payer: Self-pay | Admitting: Obstetrics and Gynecology

## 2016-10-07 NOTE — L&D Delivery Note (Signed)
Delivery Note At 1:48 PM a viable female was delivered via  (Presentation: LOA).  APGAR: 8, 9; weight pending.   Placenta status: S/I 3V Cord with the following complications: loose nuchal x 1; reduced. Cord pH: n/a  Anesthesia:  CLEA Episiotomy:  n/a Lacerations:  2nd degree Suture Repair: 3.0 vicryl rapide Est. Blood Loss (mL):  200  Mom to postpartum.  Baby to Couplet care / Skin to Skin.  Brayden Betters 07/03/2017, 2:08 PM

## 2016-11-23 IMAGING — CT CT ABD-PELV W/ CM
2 of 4 series · 14 of 36 positions shown, 17 images · IV contrast (Omni 300)
Comparison: None.

CLINICAL DATA: Fever, elevated white blood cell count. Low back
pain. Short of breath.

EXAM:
CT CHEST, ABDOMEN, AND PELVIS WITH CONTRAST
TECHNIQUE: Multidetector CT imaging of the chest, abdomen and pelvis was
performed following the standard protocol during bolus
administration of intravenous contrast.
CONTRAST:  100mL OMNIPAQUE IOHEXOL 300 MG/ML  SOLN

[Series 2: cap with 5mm st · axial · 0.81mm/px · z∈[+834,+1368]mm · 11 of 121 slices shown, 14 images]
[im 7/121  mediastinal]
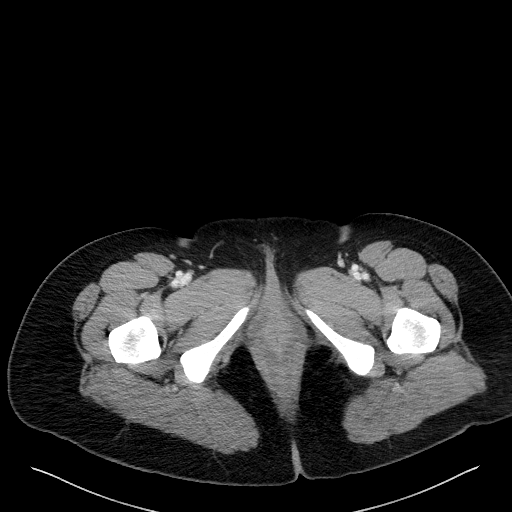
[im 7/121  lung]
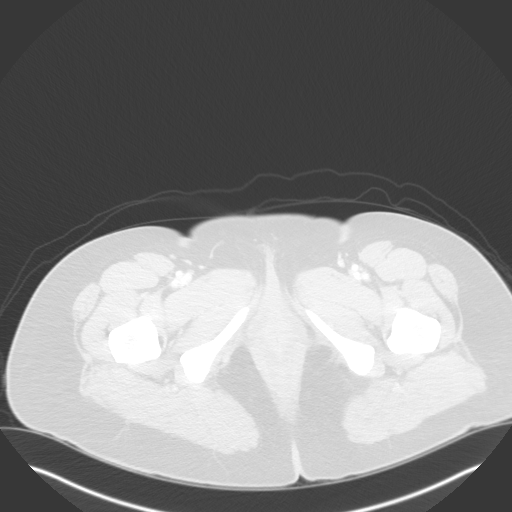
[im 19/121  lung]
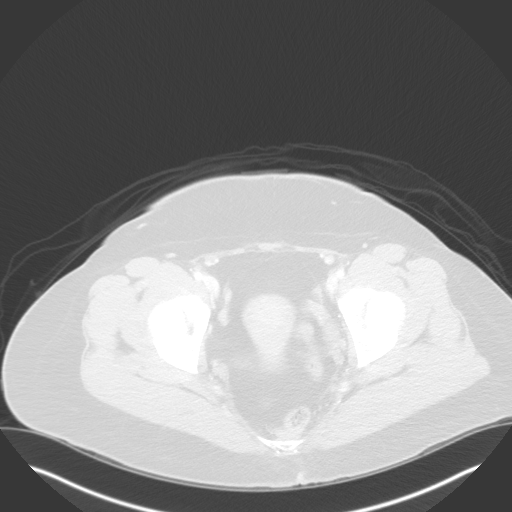
[im 32/121  lung]
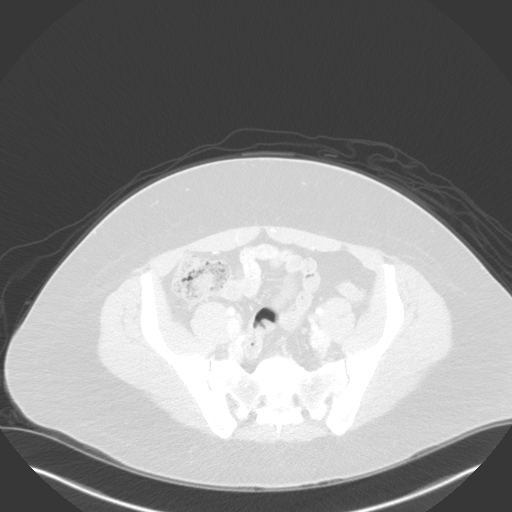
[im 38/121  lung]
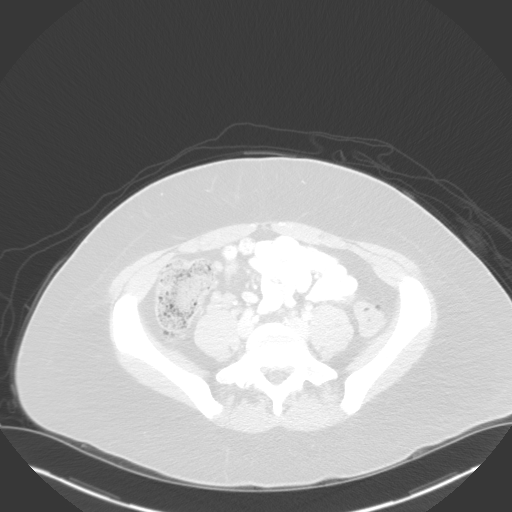
[im 51/121  mediastinal]
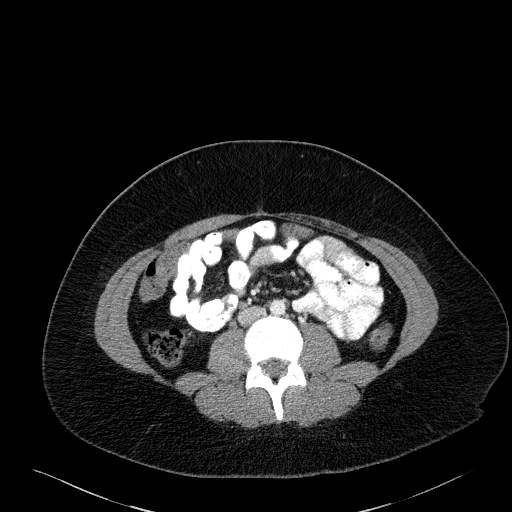
[im 51/121  lung]
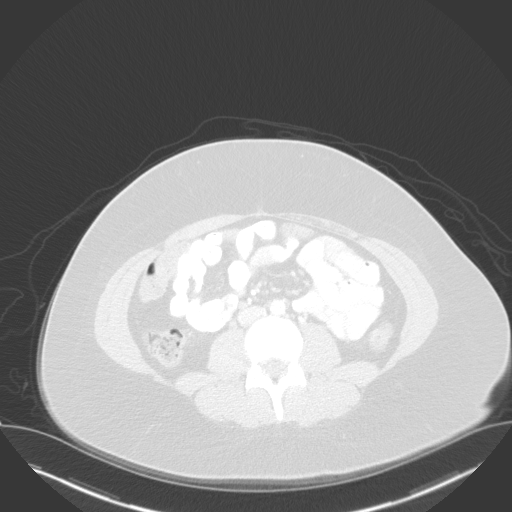
[im 64/121  lung]
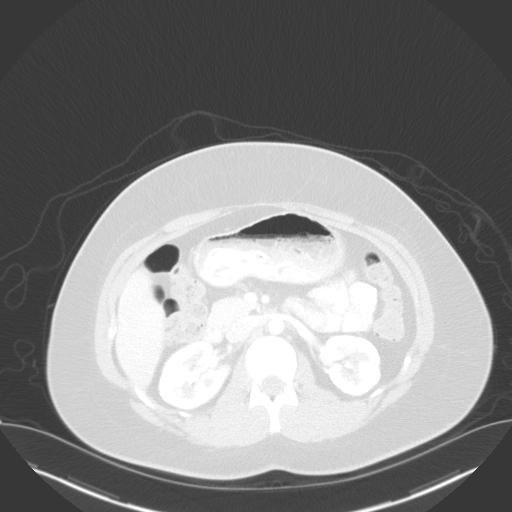
[im 70/121  lung]
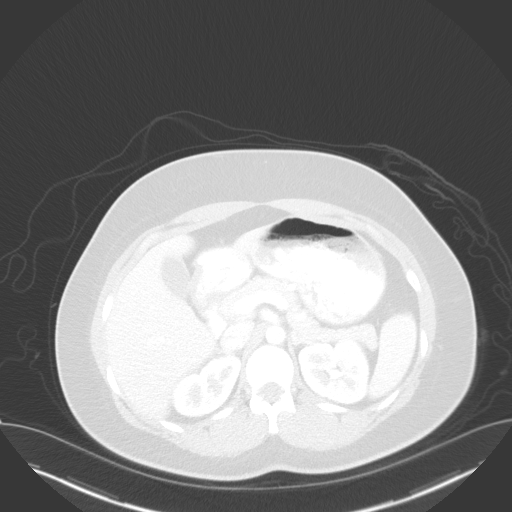
[im 83/121  lung]
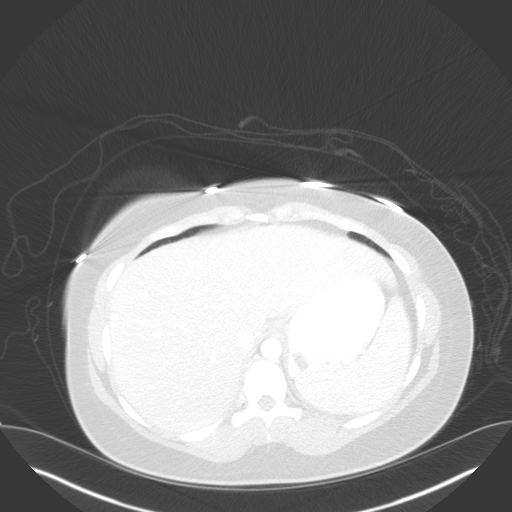
[im 89/121  mediastinal]
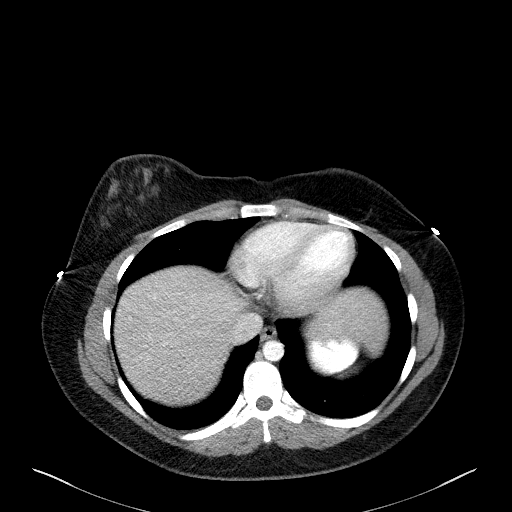
[im 89/121  lung]
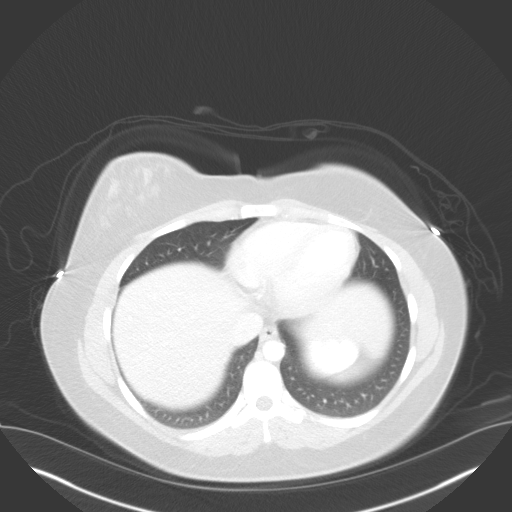
[im 102/121  lung]
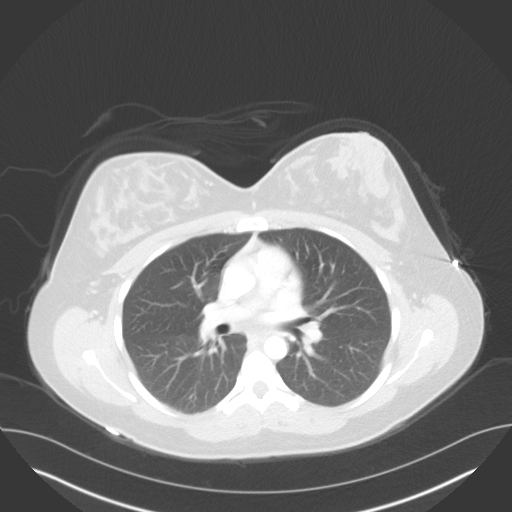
[im 114/121  lung]
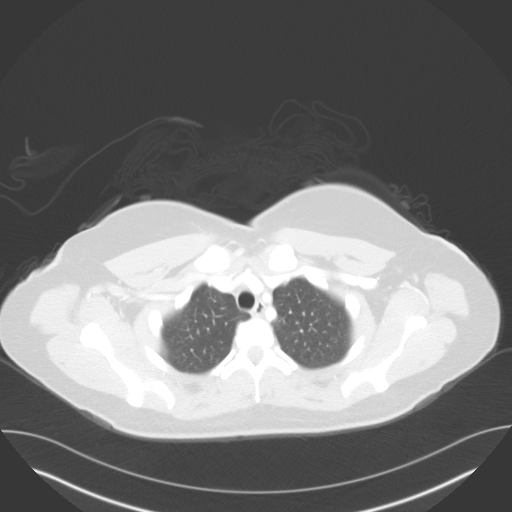

[Series 4: cap with 3mm st cor · coronal · 0.70mm/px · 3 of 101 slices shown]
[im 21/101  lung]
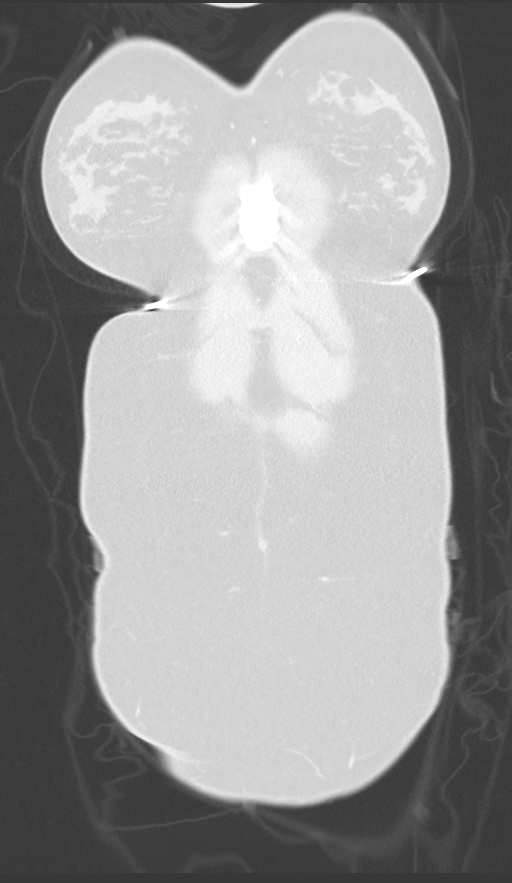
[im 41/101  lung]
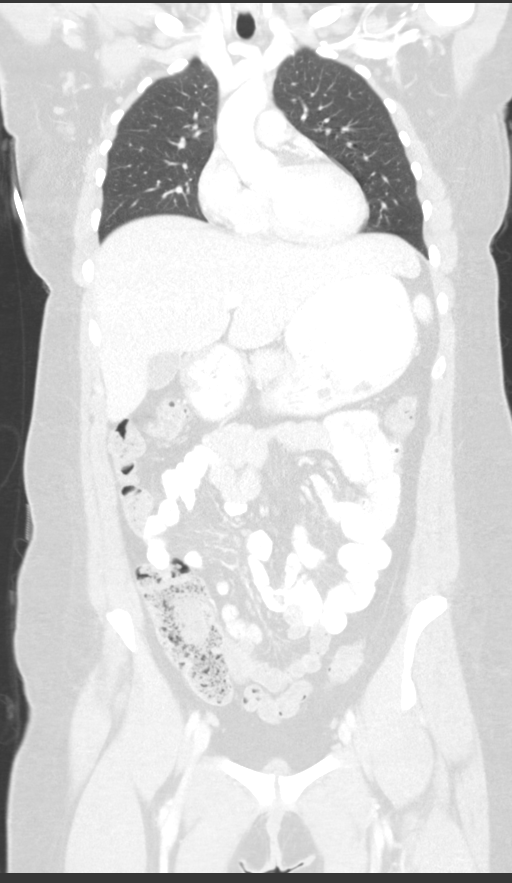
[im 61/101  lung]
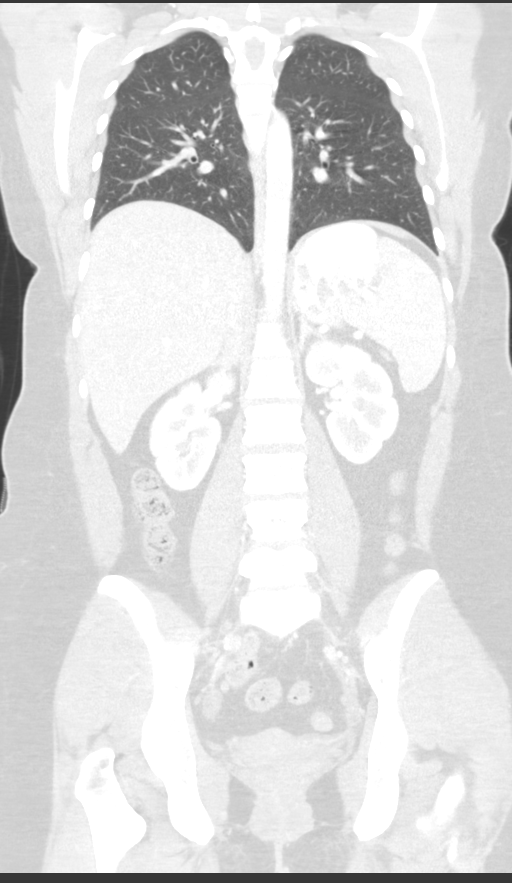

[14 of 36 positions shown; findings below may reference images not displayed]

FINDINGS: CT CHEST FINDINGS

Mediastinum/Nodes: No axillary supraclavicular lymphadenopathy. No
mediastinal hilar adenopathy. No pericardial fluid. Small amount of
residual thymus within the anterior mediastinum. Esophagus normal.

Lungs/Pleura: No a airspace disease, pleural fluid, abscess or
infection in the lungs. Airways are normal.

Musculoskeletal: No aggressive osseous lesion.

CT ABDOMEN AND PELVIS FINDINGS

Hepatobiliary: No focal hepatic lesion. No biliary ductal
dilatation. Gallbladder is normal. Common bile duct is normal.

Pancreas: Pancreas is normal. No ductal dilatation. No pancreatic
inflammation.

Spleen: Normal spleen

Adrenals/urinary tract: Adrenal glands and kidneys are normal. The
ureters and bladder normal.

Stomach/Bowel: Stomach, small bowel, appendix, and cecum are normal.
The colon and rectosigmoid colon are normal.

Vascular/Lymphatic: Abdominal aorta is normal caliber. There is no
retroperitoneal or periportal lymphadenopathy. No pelvic
lymphadenopathy.

Reproductive: Ovaries and uterus are normal.

Other: No free fluid the pelvis.  No abscess or inflammation.

Musculoskeletal: The lumbar spine is normal. There is no perispinal
inflammation. Paraspinal musculature appears normal. Psoas muscles
appear normal. Hips appear normal.
IMPRESSION: Chest Impression:

No thoracic source of infection

Abdomen / Pelvis Impression:

1. No evidence of active infection in the abdomen or pelvis.
Gallbladder, appendix, and kidneys appear normal.
2. Normal lumbar spine and pelvis.

## 2016-12-04 LAB — OB RESULTS CONSOLE ANTIBODY SCREEN: Antibody Screen: NEGATIVE

## 2016-12-04 LAB — OB RESULTS CONSOLE RPR: RPR: NONREACTIVE

## 2016-12-04 LAB — OB RESULTS CONSOLE HIV ANTIBODY (ROUTINE TESTING): HIV: NONREACTIVE

## 2016-12-04 LAB — OB RESULTS CONSOLE ABO/RH: RH Type: POSITIVE

## 2016-12-04 LAB — OB RESULTS CONSOLE RUBELLA ANTIBODY, IGM: Rubella: IMMUNE

## 2016-12-04 LAB — OB RESULTS CONSOLE GC/CHLAMYDIA
Chlamydia: NEGATIVE
GC PROBE AMP, GENITAL: NEGATIVE

## 2016-12-04 LAB — OB RESULTS CONSOLE HEPATITIS B SURFACE ANTIGEN: Hepatitis B Surface Ag: NEGATIVE

## 2017-04-15 ENCOUNTER — Inpatient Hospital Stay (HOSPITAL_COMMUNITY)
Admission: AD | Admit: 2017-04-15 | Discharge: 2017-04-15 | Disposition: A | Discharge status: Home / Self Care | Payer: BLUE CROSS/BLUE SHIELD | Source: Ambulatory Visit | Attending: Obstetrics and Gynecology | Admitting: Obstetrics and Gynecology

## 2017-04-15 ENCOUNTER — Encounter (HOSPITAL_COMMUNITY): Payer: Self-pay | Admitting: *Deleted

## 2017-04-15 DIAGNOSIS — O26893 Other specified pregnancy related conditions, third trimester: Secondary | ICD-10-CM | POA: Insufficient documentation

## 2017-04-15 DIAGNOSIS — O99613 Diseases of the digestive system complicating pregnancy, third trimester: Secondary | ICD-10-CM | POA: Diagnosis not present

## 2017-04-15 DIAGNOSIS — Z3A28 28 weeks gestation of pregnancy: Secondary | ICD-10-CM | POA: Insufficient documentation

## 2017-04-15 DIAGNOSIS — O99343 Other mental disorders complicating pregnancy, third trimester: Secondary | ICD-10-CM | POA: Diagnosis not present

## 2017-04-15 DIAGNOSIS — Z0371 Encounter for suspected problem with amniotic cavity and membrane ruled out: Secondary | ICD-10-CM

## 2017-04-15 DIAGNOSIS — N898 Other specified noninflammatory disorders of vagina: Secondary | ICD-10-CM | POA: Diagnosis not present

## 2017-04-15 DIAGNOSIS — K219 Gastro-esophageal reflux disease without esophagitis: Secondary | ICD-10-CM | POA: Diagnosis not present

## 2017-04-15 DIAGNOSIS — Z882 Allergy status to sulfonamides status: Secondary | ICD-10-CM | POA: Diagnosis not present

## 2017-04-15 DIAGNOSIS — F419 Anxiety disorder, unspecified: Secondary | ICD-10-CM | POA: Insufficient documentation

## 2017-04-15 DIAGNOSIS — Z880 Allergy status to penicillin: Secondary | ICD-10-CM | POA: Insufficient documentation

## 2017-04-15 LAB — URINALYSIS, ROUTINE W REFLEX MICROSCOPIC
BILIRUBIN URINE: NEGATIVE
GLUCOSE, UA: NEGATIVE mg/dL
HGB URINE DIPSTICK: NEGATIVE
Ketones, ur: NEGATIVE mg/dL
Leukocytes, UA: NEGATIVE
Nitrite: NEGATIVE
PH: 6 (ref 5.0–8.0)
Protein, ur: NEGATIVE mg/dL
SPECIFIC GRAVITY, URINE: 1.008 (ref 1.005–1.030)

## 2017-04-15 LAB — AMNISURE RUPTURE OF MEMBRANE (ROM) NOT AT ARMC: Amnisure ROM: NEGATIVE
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
## 2017-04-15 NOTE — MAU Note (Signed)
Pt went to the bathroom and noticed her underwear were wet around 2000.  Pt was walking and was having some abdominal pain intermittently but is not hurting right now. Denies VB.

## 2017-06-12 ENCOUNTER — Encounter (HOSPITAL_COMMUNITY): Payer: Self-pay | Admitting: *Deleted

## 2017-06-12 ENCOUNTER — Inpatient Hospital Stay (HOSPITAL_COMMUNITY)
Admission: AD | Admit: 2017-06-12 | Discharge: 2017-06-12 | Disposition: A | Discharge status: Home / Self Care | Payer: BLUE CROSS/BLUE SHIELD | Source: Ambulatory Visit | Attending: Obstetrics and Gynecology | Admitting: Obstetrics and Gynecology

## 2017-06-12 DIAGNOSIS — O26893 Other specified pregnancy related conditions, third trimester: Secondary | ICD-10-CM | POA: Insufficient documentation

## 2017-06-12 DIAGNOSIS — O9989 Other specified diseases and conditions complicating pregnancy, childbirth and the puerperium: Secondary | ICD-10-CM

## 2017-06-12 DIAGNOSIS — N898 Other specified noninflammatory disorders of vagina: Secondary | ICD-10-CM | POA: Diagnosis not present

## 2017-06-12 DIAGNOSIS — Z3A36 36 weeks gestation of pregnancy: Secondary | ICD-10-CM | POA: Diagnosis not present

## 2017-06-12 DIAGNOSIS — K219 Gastro-esophageal reflux disease without esophagitis: Secondary | ICD-10-CM | POA: Insufficient documentation

## 2017-06-12 DIAGNOSIS — Z880 Allergy status to penicillin: Secondary | ICD-10-CM | POA: Diagnosis not present

## 2017-06-12 DIAGNOSIS — O99613 Diseases of the digestive system complicating pregnancy, third trimester: Secondary | ICD-10-CM | POA: Insufficient documentation

## 2017-06-12 DIAGNOSIS — Z3689 Encounter for other specified antenatal screening: Secondary | ICD-10-CM

## 2017-06-12 LAB — AMNISURE RUPTURE OF MEMBRANE (ROM) NOT AT ARMC: AMNISURE: NEGATIVE
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
## 2017-06-12 NOTE — MAU Note (Signed)
Pt reports a gush of fluid at 1340, has had a few contractions.

## 2017-06-26 ENCOUNTER — Telehealth (HOSPITAL_COMMUNITY): Payer: Self-pay | Admitting: *Deleted

## 2017-06-26 NOTE — Telephone Encounter (Signed)
Preadmission screen  

## 2017-07-02 ENCOUNTER — Inpatient Hospital Stay (HOSPITAL_COMMUNITY)
Admission: AD | Admit: 2017-07-02 | Discharge: 2017-07-02 | Disposition: A | Discharge status: Home / Self Care | Payer: BLUE CROSS/BLUE SHIELD | Source: Ambulatory Visit | Attending: Obstetrics and Gynecology | Admitting: Obstetrics and Gynecology

## 2017-07-02 ENCOUNTER — Encounter (HOSPITAL_COMMUNITY): Payer: Self-pay | Admitting: *Deleted

## 2017-07-02 DIAGNOSIS — O479 False labor, unspecified: Secondary | ICD-10-CM

## 2017-07-02 LAB — URINALYSIS, ROUTINE W REFLEX MICROSCOPIC
Bilirubin Urine: NEGATIVE
GLUCOSE, UA: NEGATIVE mg/dL
Ketones, ur: NEGATIVE mg/dL
NITRITE: NEGATIVE
PROTEIN: NEGATIVE mg/dL
Specific Gravity, Urine: 1.024 (ref 1.005–1.030)
pH: 6 (ref 5.0–8.0)

## 2017-07-02 NOTE — Progress Notes (Signed)
Dr Elon Spanner will return call when able.

## 2017-07-02 NOTE — Discharge Instructions (Signed)
Fetal Movement Counts °Patient Name: ________________________________________________ Patient Due Date: ____________________ °What is a fetal movement count? °A fetal movement count is the number of times that you feel your baby move during a certain amount of time. This may also be called a fetal kick count. A fetal movement count is recommended for every pregnant woman. You may be asked to start counting fetal movements as early as week 28 of your pregnancy. °Pay attention to when your baby is most active. You may notice your baby's sleep and wake cycles. You may also notice things that make your baby move more. You should do a fetal movement count: °· When your baby is normally most active. °· At the same time each day. ° °A good time to count movements is while you are resting, after having something to eat and drink. °How do I count fetal movements? °1. Find a quiet, comfortable area. Sit, or lie down on your side. °2. Write down the date, the start time and stop time, and the number of movements that you felt between those two times. Take this information with you to your health care visits. °3. For 2 hours, count kicks, flutters, swishes, rolls, and jabs. You should feel at least 10 movements during 2 hours. °4. You may stop counting after you have felt 10 movements. °5. If you do not feel 10 movements in 2 hours, have something to eat and drink. Then, keep resting and counting for 1 hour. If you feel at least 4 movements during that hour, you may stop counting. °Contact a health care provider if: °· You feel fewer than 4 movements in 2 hours. °· Your baby is not moving like he or she usually does. °Date: ____________ Start time: ____________ Stop time: ____________ Movements: ____________ °Date: ____________ Start time: ____________ Stop time: ____________ Movements: ____________ °Date: ____________ Start time: ____________ Stop time: ____________ Movements: ____________ °Date: ____________ Start time:  ____________ Stop time: ____________ Movements: ____________ °Date: ____________ Start time: ____________ Stop time: ____________ Movements: ____________ °Date: ____________ Start time: ____________ Stop time: ____________ Movements: ____________ °Date: ____________ Start time: ____________ Stop time: ____________ Movements: ____________ °Date: ____________ Start time: ____________ Stop time: ____________ Movements: ____________ °Date: ____________ Start time: ____________ Stop time: ____________ Movements: ____________ °This information is not intended to replace advice given to you by your health care provider. Make sure you discuss any questions you have with your health care provider. °Document Released: 10/23/2006 Document Revised: 05/22/2016 Document Reviewed: 11/02/2015 °Elsevier Interactive Patient Education © 2018 [employer] °Braxton Hicks Contractions °Contractions of the uterus can occur throughout pregnancy, but they are not always a sign that you are in labor. You may have practice contractions called Braxton Hicks contractions. These false labor contractions are sometimes confused with true labor. °What are Braxton Hicks contractions? °Braxton Hicks contractions are tightening movements that occur in the muscles of the uterus before labor. Unlike true labor contractions, these contractions do not result in opening (dilation) and thinning of the cervix. Toward the end of pregnancy (32-34 weeks), Braxton Hicks contractions can happen more often and may become stronger. These contractions are sometimes difficult to tell apart from true labor because they can be very uncomfortable. You should not feel embarrassed if you go to the hospital with false labor. °Sometimes, the only way to tell if you are in true labor is for your health care provider to look for changes in the cervix. The health care provider will do a physical exam and may monitor your contractions. If   you are not in true labor, the exam  should show that your cervix is not dilating and your water has not broken. °If there are no prenatal problems or other health problems associated with your pregnancy, it is completely safe for you to be sent home with false labor. You may continue to have Braxton Hicks contractions until you go into true labor. °How can I tell the difference between true labor and false labor? °· Differences °? False labor °? Contractions last 30-70 seconds.: Contractions are usually shorter and not as strong as true labor contractions. °? Contractions become very regular.: Contractions are usually irregular. °? Discomfort is usually felt in the top of the uterus, and it spreads to the lower abdomen and low back.: Contractions are often felt in the front of the lower abdomen and in the groin. °? Contractions do not go away with walking.: Contractions may go away when you walk around or change positions while lying down. °? Contractions usually become more intense and increase in frequency.: Contractions get weaker and are shorter-lasting as time goes on. °? The cervix dilates and gets thinner.: The cervix usually does not dilate or become thin. °Follow these instructions at home: °· Take over-the-counter and prescription medicines only as told by your health care provider. °· Keep up with your usual exercises and follow other instructions from your health care provider. °· Eat and drink lightly if you think you are going into labor. °· If Braxton Hicks contractions are making you uncomfortable: °? Change your position from lying down or resting to walking, or change from walking to resting. °? Sit and rest in a tub of warm water. °? Drink enough fluid to keep your urine clear or pale yellow. Dehydration may cause these contractions. °? Do slow and deep breathing several times an hour. °· Keep all follow-up prenatal visits as told by your health care provider. This is important. °Contact a health care provider if: °· You have a  fever. °· You have continuous pain in your abdomen. °Get help right away if: °· Your contractions become stronger, more regular, and closer together. °· You have fluid leaking or gushing from your vagina. °· You pass blood-tinged mucus (bloody show). °· You have bleeding from your vagina. °· You have low back pain that you never had before. °· You feel your baby’s head pushing down and causing pelvic pressure. °· Your baby is not moving inside you as much as it used to. °Summary °· Contractions that occur before labor are called Braxton Hicks contractions, false labor, or practice contractions. °· Braxton Hicks contractions are usually shorter, weaker, farther apart, and less regular than true labor contractions. True labor contractions usually become progressively stronger and regular and they become more frequent. °· Manage discomfort from Braxton Hicks contractions by changing position, resting in a warm bath, drinking plenty of water, or practicing deep breathing. °This information is not intended to replace advice given to you by your health care provider. Make sure you discuss any questions you have with your health care provider. °Document Released: 09/23/2005 Document Revised: 08/12/2016 Document Reviewed: 08/12/2016 °Elsevier Interactive Patient Education © 2017 [employer] ° °

## 2017-07-02 NOTE — MAU Note (Signed)
I have communicated with Elon Spanner and reviewed vital signs:  Vitals:   07/02/17 1100 07/02/17 1115  BP: 132/89 120/82  Pulse: (!) 104   Resp:      Vaginal exam:  Dilation: 4 Effacement (%): 80 Station: -3 Presentation: Vertex Exam by:: Murrel Freet rn,   Also reviewed contraction pattern and that non-stress test is reactive.  It has been documented that patient is contracting every 7-9 minutes with minimal cervical change over 1.5 hours not indicating active labor.  Patient denies any other complaints.  Based on this report provider has given order for discharge.  A discharge order and diagnosis entered by a provider.   Labor discharge instructions reviewed with patient.

## 2017-07-02 NOTE — MAU Note (Signed)
..   I have communicated with Dr. Velvet Bathe and reviewed vital signs:  Vitals:   07/02/17 1557 07/02/17 1651  BP:  117/79  Pulse:  (!) 101  Resp:  20  Temp: 98.7 F (37.1 C) 98.1 F (36.7 C)    Vaginal exam:  Dilation: 4 Effacement (%): 60 Station: -3 Exam by:: Jaton burgess rnc,   Also reviewed contraction pattern and that non-stress test is reactive.  It has been documented that patient is contracting every 6-8 minutes with no cervical change over three hours not indicating active labor.  Patient denies any other complaints.  Based on this report provider has given order for discharge.  A discharge order and diagnosis entered by a provider.   Labor discharge instructions reviewed with patient.

## 2017-07-02 NOTE — MAU Note (Signed)
Pt here x3hrs ago.  States UCs Q67mins & more intense.  Denies LOF/vag bleeding. +FM.

## 2017-07-02 NOTE — MAU Note (Signed)
Pt presents to MAU with complaints of contractions, Dr Vincente Poli stripped her membranes in the office yesterday. Bloody show this morning when she wipes

## 2017-07-02 NOTE — Progress Notes (Signed)
D/c instructions discussed & reviewed.  Pt denies ques/concerns.  Pt d/c home with family in stable condition.

## 2017-07-02 NOTE — Progress Notes (Signed)
Dr Elon Spanner called & report given, strip reviewed remotely. Orders rec'd.

## 2017-07-03 ENCOUNTER — Inpatient Hospital Stay (HOSPITAL_COMMUNITY)
Admission: RE | Admit: 2017-07-03 | Discharge: 2017-07-05 | DRG: 775 | Disposition: A | Discharge status: Home / Self Care | Payer: BLUE CROSS/BLUE SHIELD | Source: Ambulatory Visit | Attending: Obstetrics & Gynecology | Admitting: Obstetrics & Gynecology

## 2017-07-03 ENCOUNTER — Inpatient Hospital Stay (HOSPITAL_COMMUNITY): Payer: BLUE CROSS/BLUE SHIELD | Admitting: Anesthesiology

## 2017-07-03 ENCOUNTER — Encounter (HOSPITAL_COMMUNITY): Payer: Self-pay

## 2017-07-03 DIAGNOSIS — O99214 Obesity complicating childbirth: Secondary | ICD-10-CM | POA: Diagnosis present

## 2017-07-03 DIAGNOSIS — Z6835 Body mass index (BMI) 35.0-35.9, adult: Secondary | ICD-10-CM | POA: Diagnosis not present

## 2017-07-03 DIAGNOSIS — Z23 Encounter for immunization: Secondary | ICD-10-CM | POA: Diagnosis not present

## 2017-07-03 DIAGNOSIS — E669 Obesity, unspecified: Secondary | ICD-10-CM | POA: Diagnosis present

## 2017-07-03 DIAGNOSIS — Z3A39 39 weeks gestation of pregnancy: Secondary | ICD-10-CM

## 2017-07-03 DIAGNOSIS — O26893 Other specified pregnancy related conditions, third trimester: Secondary | ICD-10-CM | POA: Diagnosis present

## 2017-07-03 DIAGNOSIS — Z3490 Encounter for supervision of normal pregnancy, unspecified, unspecified trimester: Secondary | ICD-10-CM

## 2017-07-03 LAB — RPR: RPR: NONREACTIVE

## 2017-07-03 LAB — CBC
HCT: 39.4 % (ref 36.0–46.0)
HEMOGLOBIN: 13.4 g/dL (ref 12.0–15.0)
MCH: 28.8 pg (ref 26.0–34.0)
MCHC: 34 g/dL (ref 30.0–36.0)
MCV: 84.7 fL (ref 78.0–100.0)
Platelets: 183 10*3/uL (ref 150–400)
RBC: 4.65 MIL/uL (ref 3.87–5.11)
RDW: 14.3 % (ref 11.5–15.5)
WBC: 11.4 10*3/uL — AB (ref 4.0–10.5)

## 2017-07-03 LAB — TYPE AND SCREEN
ABO/RH(D): O POS
Antibody Screen: NEGATIVE

## 2017-07-03 LAB — OB RESULTS CONSOLE GBS: STREP GROUP B AG: NEGATIVE

## 2017-07-03 MED ORDER — TETANUS-DIPHTH-ACELL PERTUSSIS 5-2.5-18.5 LF-MCG/0.5 IM SUSP: 0.5000 mL | Freq: Once | INTRAMUSCULAR | Status: DC

## 2017-07-03 MED ORDER — DIPHENHYDRAMINE HCL 25 MG PO CAPS: 25.0000 mg | ORAL_CAPSULE | Freq: Four times a day (QID) | ORAL | Status: DC | PRN

## 2017-07-03 MED ORDER — TERBUTALINE SULFATE 1 MG/ML IJ SOLN
0.2500 mg | Freq: Once | INTRAMUSCULAR | Status: DC | PRN
  Filled 2017-07-03: qty 1

## 2017-07-03 MED ORDER — DIBUCAINE 1 % RE OINT: 1.0000 "application " | TOPICAL_OINTMENT | RECTAL | Status: DC | PRN

## 2017-07-03 MED ORDER — BENZOCAINE-MENTHOL 20-0.5 % EX AERO
1.0000 "application " | INHALATION_SPRAY | CUTANEOUS | Status: DC | PRN
  Administered 2017-07-03: 1 via TOPICAL
  Filled 2017-07-03: qty 56

## 2017-07-03 MED ORDER — WITCH HAZEL-GLYCERIN EX PADS
1.0000 "application " | MEDICATED_PAD | CUTANEOUS | Status: DC | PRN
  Administered 2017-07-03: 1 via TOPICAL

## 2017-07-03 MED ORDER — OXYTOCIN BOLUS FROM INFUSION: 500.0000 mL | Freq: Once | INTRAVENOUS | Status: DC

## 2017-07-03 MED ORDER — IBUPROFEN 600 MG PO TABS
600.0000 mg | ORAL_TABLET | Freq: Four times a day (QID) | ORAL | Status: DC
  Administered 2017-07-03 – 2017-07-05 (×8): 600 mg via ORAL
  Filled 2017-07-03 (×8): qty 1

## 2017-07-03 MED ORDER — DIPHENHYDRAMINE HCL 50 MG/ML IJ SOLN: 12.5000 mg | INTRAMUSCULAR | Status: DC | PRN

## 2017-07-03 MED ORDER — COCONUT OIL OIL: 1.0000 "application " | TOPICAL_OIL | Status: DC | PRN

## 2017-07-03 MED ORDER — ACETAMINOPHEN 325 MG PO TABS
650.0000 mg | ORAL_TABLET | ORAL | Status: DC | PRN
  Filled 2017-07-03 (×2): qty 2

## 2017-07-03 MED ORDER — OXYCODONE-ACETAMINOPHEN 5-325 MG PO TABS: 1.0000 | ORAL_TABLET | ORAL | Status: DC | PRN

## 2017-07-03 MED ORDER — OXYCODONE-ACETAMINOPHEN 5-325 MG PO TABS: 2.0000 | ORAL_TABLET | ORAL | Status: DC | PRN

## 2017-07-03 MED ORDER — FENTANYL 2.5 MCG/ML BUPIVACAINE 1/10 % EPIDURAL INFUSION (WH - ANES)
14.0000 mL/h | INTRAMUSCULAR | Status: DC | PRN
  Administered 2017-07-03: 14 mL/h via EPIDURAL
  Filled 2017-07-03: qty 100

## 2017-07-03 MED ORDER — SOD CITRATE-CITRIC ACID 500-334 MG/5ML PO SOLN: 30.0000 mL | ORAL | Status: DC | PRN

## 2017-07-03 MED ORDER — FENTANYL CITRATE (PF) 100 MCG/2ML IJ SOLN: 50.0000 ug | INTRAMUSCULAR | Status: DC | PRN

## 2017-07-03 MED ORDER — OXYTOCIN 40 UNITS IN LACTATED RINGERS INFUSION - SIMPLE MED
1.0000 m[IU]/min | INTRAVENOUS | Status: DC
  Administered 2017-07-03: 2 m[IU]/min via INTRAVENOUS
  Filled 2017-07-03: qty 1000

## 2017-07-03 MED ORDER — FLEET ENEMA 7-19 GM/118ML RE ENEM: 1.0000 | ENEMA | RECTAL | Status: DC | PRN

## 2017-07-03 MED ORDER — LIDOCAINE HCL (PF) 1 % IJ SOLN
INTRAMUSCULAR | Status: DC | PRN
  Administered 2017-07-03 (×2): 4 mL via EPIDURAL

## 2017-07-03 MED ORDER — LORATADINE 10 MG PO TABS
10.0000 mg | ORAL_TABLET | Freq: Every day | ORAL | Status: DC
  Administered 2017-07-03: 10 mg via ORAL
  Filled 2017-07-03: qty 1

## 2017-07-03 MED ORDER — SIMETHICONE 80 MG PO CHEW: 80.0000 mg | CHEWABLE_TABLET | ORAL | Status: DC | PRN

## 2017-07-03 MED ORDER — OXYTOCIN 40 UNITS IN LACTATED RINGERS INFUSION - SIMPLE MED: 2.5000 [IU]/h | INTRAVENOUS | Status: DC

## 2017-07-03 MED ORDER — LACTATED RINGERS IV SOLN
INTRAVENOUS | Status: DC
  Administered 2017-07-03 (×2): via INTRAVENOUS

## 2017-07-03 MED ORDER — LIDOCAINE HCL (PF) 1 % IJ SOLN
30.0000 mL | INTRAMUSCULAR | Status: DC | PRN
  Filled 2017-07-03: qty 30

## 2017-07-03 MED ORDER — ONDANSETRON HCL 4 MG/2ML IJ SOLN: 4.0000 mg | INTRAMUSCULAR | Status: DC | PRN

## 2017-07-03 MED ORDER — LACTATED RINGERS IV SOLN: 500.0000 mL | INTRAVENOUS | Status: DC | PRN

## 2017-07-03 MED ORDER — EPHEDRINE 5 MG/ML INJ
10.0000 mg | INTRAVENOUS | Status: DC | PRN
  Filled 2017-07-03: qty 2

## 2017-07-03 MED ORDER — PRENATAL MULTIVITAMIN CH
1.0000 | ORAL_TABLET | Freq: Every day | ORAL | Status: DC
  Administered 2017-07-04 – 2017-07-05 (×2): 1 via ORAL
  Filled 2017-07-03 (×2): qty 1

## 2017-07-03 MED ORDER — PHENYLEPHRINE 40 MCG/ML (10ML) SYRINGE FOR IV PUSH (FOR BLOOD PRESSURE SUPPORT)
80.0000 ug | PREFILLED_SYRINGE | INTRAVENOUS | Status: DC | PRN
  Filled 2017-07-03: qty 10
  Filled 2017-07-03: qty 5

## 2017-07-03 MED ORDER — ONDANSETRON HCL 4 MG PO TABS: 4.0000 mg | ORAL_TABLET | ORAL | Status: DC | PRN

## 2017-07-03 MED ORDER — ZOLPIDEM TARTRATE 5 MG PO TABS: 5.0000 mg | ORAL_TABLET | Freq: Every evening | ORAL | Status: DC | PRN

## 2017-07-03 MED ORDER — PHENYLEPHRINE 40 MCG/ML (10ML) SYRINGE FOR IV PUSH (FOR BLOOD PRESSURE SUPPORT)
80.0000 ug | PREFILLED_SYRINGE | INTRAVENOUS | Status: DC | PRN
  Filled 2017-07-03: qty 5

## 2017-07-03 MED ORDER — LACTATED RINGERS IV SOLN: 500.0000 mL | Freq: Once | INTRAVENOUS | Status: DC

## 2017-07-03 MED ORDER — SENNOSIDES-DOCUSATE SODIUM 8.6-50 MG PO TABS
2.0000 | ORAL_TABLET | ORAL | Status: DC
  Administered 2017-07-03 – 2017-07-05 (×2): 2 via ORAL
  Filled 2017-07-03 (×2): qty 2

## 2017-07-03 MED ORDER — ONDANSETRON HCL 4 MG/2ML IJ SOLN: 4.0000 mg | Freq: Four times a day (QID) | INTRAMUSCULAR | Status: DC | PRN

## 2017-07-03 MED ORDER — ACETAMINOPHEN 325 MG PO TABS
650.0000 mg | ORAL_TABLET | ORAL | Status: DC | PRN
  Administered 2017-07-03 – 2017-07-05 (×5): 650 mg via ORAL
  Filled 2017-07-03 (×3): qty 2

## 2017-07-03 NOTE — Anesthesia Postprocedure Evaluation (Signed)
Anesthesia Post Note  Patient: [place] Deen  Procedure(s) Performed: * No procedures listed *     Patient location during evaluation: Mother Baby Anesthesia Type: Epidural Level of consciousness: awake and alert and oriented Pain management: satisfactory to patient Vital Signs Assessment: post-procedure vital signs reviewed and stable Respiratory status: respiratory function stable Cardiovascular status: stable Postop Assessment: no headache, no backache, epidural receding, patient able to bend at knees, no signs of nausea or vomiting and adequate PO intake Anesthetic complications: no    Last Vitals:  Vitals:   07/03/17 1650 07/03/17 2030  BP: 132/86 121/86  Pulse: 86 89  Resp: 18 18  Temp: 36.9 C 36.6 C  SpO2:  100%    Last Pain:  Vitals:   07/03/17 2030  TempSrc: Oral  PainSc: 2    Pain Goal: Patients Stated Pain Goal: 4 (07/03/17 1545)               Russell Engelstad

## 2017-07-03 NOTE — Anesthesia Procedure Notes (Signed)
Epidural Patient location during procedure: OB Start time: 07/03/2017 10:49 AM  Staffing Anesthesiologist: Karna Christmas P Performed: anesthesiologist   Preanesthetic Checklist Completed: patient identified, site marked, pre-op evaluation, timeout performed, IV checked, risks and benefits discussed and monitors and equipment checked  Epidural Patient position: sitting Prep: DuraPrep Patient monitoring: heart rate, cardiac monitor, continuous pulse ox and blood pressure Approach: midline Location: L3-L4 Injection technique: LOR air  Needle:  Needle type: Tuohy  Needle gauge: 17 G Needle length: 9 cm Needle insertion depth: 8 cm Catheter type: closed end flexible Catheter size: 19 Gauge Catheter at skin depth: 13 cm Test dose: negative and Other  Assessment Events: blood not aspirated, injection not painful, no injection resistance and negative IV test  Additional Notes Informed consent obtained prior to proceeding including risk of failure, 1% risk of PDPH, risk of minor discomfort and bruising.  Discussed rare but serious complications.  Discussed alternatives to epidural analgesia and patient desires to proceed.  Timeout performed pre-procedure verifying patient name, procedure, and platelet count.  Patient tolerated procedure well. Reason for block:procedure for pain
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
## 2017-07-03 NOTE — Anesthesia Pain Management Evaluation Note (Signed)
  [facility] Pain Management Visit Note  Patient: Vanessa Stone female  "Hello I am a member of the anesthesia team at [facility]. We have an anesthesia team available at all times to provide care throughout the hospital, including epidural management and anesthesia for C-section. I don't know your plan for the delivery whether it a natural birth, water birth, IV sedation, nitrous supplementation, doula or epidural, but we want to meet your pain goals."   1.Was your pain managed to your expectations on prior hospitalizations?   No prior hospitalizations  2.What is your expectation for pain management during this hospitalization?     Labor support without medications  3.How can we help you reach that goal? unsure  Record the patient's initial score and the patient's pain goal.   Pain: 3  Pain Goal: 8 [facility] wants you to be able to say your pain was always managed very well.  Edison Pace 07/03/2017

## 2017-07-03 NOTE — Anesthesia Preprocedure Evaluation (Signed)
Anesthesia Evaluation  Patient identified by MRN, date of birth, ID band Patient awake    Reviewed: Allergy & Precautions, Patient's Chart, lab work & pertinent test results  Airway Mallampati: III  TM Distance: >3 FB Neck ROM: Full    Dental no notable dental hx. (+) Teeth Intact   Pulmonary neg pulmonary ROS,    Pulmonary exam normal breath sounds clear to auscultation       Cardiovascular hypertension, negative cardio ROS Normal cardiovascular exam Rhythm:Regular Rate:Normal     Neuro/Psych Anxiety negative neurological ROS     GI/Hepatic Neg liver ROS, GERD  Medicated and Controlled,IBS (irritable bowel syndrome)   Endo/Other  Obesity  Renal/GU negative Renal ROS     Musculoskeletal negative musculoskeletal ROS (+)   Abdominal (+) + obese,   Peds  Hematology negative hematology ROS (+)   Anesthesia Other Findings   Reproductive/Obstetrics (+) Pregnancy                             Anesthesia Physical  Anesthesia Plan  ASA: II  Anesthesia Plan: Epidural   Post-op Pain Management:    Induction:   PONV Risk Score and Plan:   Airway Management Planned: Natural Airway  Additional Equipment:   Intra-op Plan:   Post-operative Plan:   Informed Consent: I have reviewed the patients History and Physical, chart, labs and discussed the procedure including the risks, benefits and alternatives for the proposed anesthesia with the patient or authorized representative who has indicated his/her understanding and acceptance.     Plan Discussed with: Anesthesiologist  Anesthesia Plan Comments:         Anesthesia Quick Evaluation

## 2017-07-04 LAB — CBC
HEMATOCRIT: 37 % (ref 36.0–46.0)
HEMOGLOBIN: 12.3 g/dL (ref 12.0–15.0)
MCH: 28.5 pg (ref 26.0–34.0)
MCHC: 33.2 g/dL (ref 30.0–36.0)
MCV: 85.6 fL (ref 78.0–100.0)
Platelets: 157 10*3/uL (ref 150–400)
RBC: 4.32 MIL/uL (ref 3.87–5.11)
RDW: 14.5 % (ref 11.5–15.5)
WBC: 11.3 10*3/uL — AB (ref 4.0–10.5)

## 2017-07-04 MED ORDER — IBUPROFEN 600 MG PO TABS: 600.0000 mg | ORAL_TABLET | Freq: Four times a day (QID) | ORAL | 0 refills | Status: DC

## 2017-07-04 MED ORDER — INFLUENZA VAC SPLIT QUAD 0.5 ML IM SUSY
0.5000 mL | PREFILLED_SYRINGE | INTRAMUSCULAR | Status: AC
  Administered 2017-07-04: 0.5 mL via INTRAMUSCULAR

## 2017-07-04 NOTE — Discharge Summary (Signed)
Obstetric Discharge Summary Reason for Admission: induction of labor Prenatal Procedures: none Intrapartum Procedures: spontaneous vaginal delivery Postpartum Procedures: none Complications-Operative and Postpartum: none Hemoglobin  Date Value Ref Range Status  07/04/2017 12.3 12.0 - 15.0 g/dL Final   HCT  Date Value Ref Range Status  07/04/2017 37.0 36.0 - 46.0 % Final    Physical Exam:  General: alert Lochia: appropriate Uterine Fundus: firm Incision: healing well DVT Evaluation: No evidence of DVT seen on physical exam.  Discharge Diagnoses: Term Pregnancy-delivered  Discharge Information: Date: 07/04/2017 Activity: pelvic rest Diet: routine Medications: PNV and Ibuprofen Condition: stable Instructions: refer to practice specific booklet Discharge to: home Follow-up Information    [place], [facility] Schedule an appointment as soon as possible for a visit in 6 week(s).   Contact information: [address] [place] [address] [phone]           Newborn Data: Live born female  Birth Weight: 8 lb 13.3 oz (4005 g) APGAR: 8, 9  Home with mother.  Anacaren Kohan M 07/04/2017, 8:50 AM

## 2017-07-04 NOTE — Lactation Note (Signed)
This note was copied from a baby's chart. Lactation Consultation Note Mom BF her 1st child now 26 yrs old for 9 months until mom became sick ran high fevers, milk supply dried up. Mom hopes to BF 1 yr with this baby.  Mom has pendulous breast w/everted nipples. [place] and nipple has edema. Reverse pressure very helpful in softening. Mom has thick nipples.  Mom using DEBP for stimulation. Pumped 1 ml colostrum, mom plans to give in spoon.  Mom fixing to BF baby, baby gagging, spit mucous. Used suction bulb. Mom has baby in her gown STS. Encouraged mom to try again a an hour or two.  Mom encouraged to feed baby 8-12 times/24 hours and with feeding cues. Stressed importance of I&O, STS, cluster feeding, supply and demand. [facility] brochure given w/resources, support groups and LC services. Patient Name: Vanessa Stone Today's Date: 07/04/2017 Reason for consult: Initial assessment   Maternal Data Has patient been taught Hand Expression?: Yes Does the patient have breastfeeding experience prior to this delivery?: Yes  Feeding Feeding Type: Breast Fed Length of feed: 5 min  LATCH Score Latch: Too sleepy or reluctant, no latch achieved, no sucking elicited.     Type of Nipple: Everted at rest and after stimulation  Comfort (Breast/Nipple): Soft / non-tender        Interventions Interventions: Breast feeding basics reviewed;Breast compression;Skin to skin;Support pillows;Breast massage;Position options;Hand express  Lactation Tools Discussed/Used     Consult Status Consult Status: Follow-up Date: 07/05/17 Follow-up type: In-patient    Charyl Dancer 07/04/2017, 4:56 AM

## 2017-07-04 NOTE — Progress Notes (Signed)
IV removed from Left Forearm , pt tolerated well.

## 2017-07-05 NOTE — Lactation Note (Signed)
This note was copied from a baby's chart. Lactation Consultation Note  Patient Name: Vanessa Stone Date: 07/05/2017   Baby 42 hours old. Mom reports some nipple soreness. Baby cueing to nurse, but has pacifier in mouth. Provided pacifier teaching--how use can mask cues and best to use when BF well established. Mom reports that she is not sure her milk volume is increasing. Assisted mom with hand expression and mom easily expressible with milk flowing bilaterally. Mom reports that her milk did not increase until day 4 with first baby, and she did supplement. Mom states that she sent FOB to get formula just in case she needs it when she gets home with this baby. Discussed milk coming to volume--sooner and in larger amount usually with second child. Discussed the need to turn baby chest-to-chest and support newborn's head and her breast while nursing. Assisted mom with positioning herself and use of pillows for support, then latched baby to mom's left breast in cross-cradle position, and mom reported increased comfort at breast. Mom is having uterine cramping while nursing, and reports a 4 out of 10 pain level--which was reported to [place], Charity fundraiser. Enc mom to keep nursing with cues.   Maternal Data    Feeding Feeding Type: Breast Fed Length of feed: 120 min (off-and-on, cluster-feeding per mom.)  LATCH Score Latch: Grasps breast easily, tongue down, lips flanged, rhythmical sucking.  Audible Swallowing: A few with stimulation  Type of Nipple: Everted at rest and after stimulation  Comfort (Breast/Nipple): Filling, red/small blisters or bruises, mild/mod discomfort  Hold (Positioning): Assistance needed to correctly position infant at breast and maintain latch.  LATCH Score: 7  Interventions Interventions: Breast feeding basics reviewed;Assisted with latch;Skin to skin;Hand express;Breast compression;Adjust position;Support pillows;Position options  Lactation Tools Discussed/Used      Consult Status Consult Status: PRN    Sherlyn Hay 07/05/2017, 8:33 AM

## 2024-06-28 ENCOUNTER — Encounter: Payer: Self-pay | Admitting: Gastroenterology
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
## 2024-07-28 ENCOUNTER — Ambulatory Visit (INDEPENDENT_AMBULATORY_CARE_PROVIDER_SITE_OTHER): Admitting: Gastroenterology

## 2024-07-28 ENCOUNTER — Encounter: Payer: Self-pay | Admitting: *Deleted

## 2024-07-28 ENCOUNTER — Other Ambulatory Visit: Payer: Self-pay | Admitting: *Deleted

## 2024-07-28 ENCOUNTER — Telehealth: Payer: Self-pay | Admitting: *Deleted

## 2024-07-28 ENCOUNTER — Encounter: Payer: Self-pay | Admitting: Gastroenterology

## 2024-07-28 VITALS — BP 106/71 | HR 88 | Temp 97.5°F | Ht 65.0 in | Wt 141.8 lb

## 2024-07-28 DIAGNOSIS — Z83719 Family history of colon polyps, unspecified: Secondary | ICD-10-CM

## 2024-07-28 DIAGNOSIS — K581 Irritable bowel syndrome with constipation: Secondary | ICD-10-CM

## 2024-07-28 DIAGNOSIS — Z8379 Family history of other diseases of the digestive system: Secondary | ICD-10-CM | POA: Diagnosis not present

## 2024-07-28 DIAGNOSIS — K649 Unspecified hemorrhoids: Secondary | ICD-10-CM

## 2024-07-28 DIAGNOSIS — Z800 Family history of malignant neoplasm of digestive organs: Secondary | ICD-10-CM

## 2024-07-28 DIAGNOSIS — K625 Hemorrhage of anus and rectum: Secondary | ICD-10-CM

## 2024-07-28 DIAGNOSIS — K219 Gastro-esophageal reflux disease without esophagitis: Secondary | ICD-10-CM

## 2024-07-28 MED ORDER — PEG 3350-KCL-NA BICARB-NACL 420 G PO SOLR: 4000.0000 mL | Freq: Once | ORAL | 0 refills | Status: AC

## 2024-07-28 NOTE — Telephone Encounter (Signed)
 Carelon PA: For [id number] & [id number] Order ID: [id number]       Completed  Approval Valid Through: 07/28/2024 - 09/25/2024

## 2024-07-28 NOTE — Patient Instructions (Addendum)
 I would like for you to start taking MiraLAX 1 capful twice daily and a once daily stool softener to help with your constipation.  If on a regular basis you are noting significant looser stools and I want you to stop a stool softener but continue the MiraLAX.  I recommend you start taking famotidine 20 mg once daily.  I want you to take this in the morning if you are noticing more reflux symptoms or left upper quadrant pain throughout the day or to take 30 minutes prior to dinner and if you are noticing symptoms more in the evenings.  Continue to follow gluten-free diet.  I will assess for the gene for celiac for you as well as antibodies and this will also let us  know if you are having any potential exposure to gluten.  If you are having some occasional rectal bleeding or pain you may use over-the-counter hemorrhoid cream as needed or apply witch hazel externally.  We will get you scheduled for an upper endoscopy and colonoscopy in the near future with Dr. Cindie.  Our schedulers will reach out to you to get you scheduled.  You will need to do a urine pregnancy test within 3 days prior to your procedure.  You will have to hold your Ozempic for 1 week prior to the procedure.  During this time if you are having issues with blood sugars please keep a sugary drink or candy at your bedside to avoid low blood sugars.  You may also talk to your primary care about glucose tablets to keep on hand if needed.  We will follow-up in about 3 months.  It was a pleasure to see you today. I want to create trusting relationships with patients. If you receive a survey regarding your visit,  I greatly appreciate you taking time to fill this out on paper or through your MyChart. I value your feedback.  Charmaine Melia, MSN, FNP-BC, AGACNP-BC [facility]

## 2024-08-04 ENCOUNTER — Telehealth: Payer: Self-pay | Admitting: Gastroenterology

## 2024-08-04 NOTE — Telephone Encounter (Signed)
 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 I do not have results of the celiac gene testing however - need to see if this is in process or just not drawn. If not drawn I would like for her to complete this.   Hgb normal- 13 range, iron levels, Cmp normal. WBC slightly elevated at 11.3 along with ferritin >240- could be nonspecific but could also mean some mild inflammation is present in the body.   Will await findings of upper endoscopy and colonoscopy to give further recommendations. No changes to recommendations given at office visit.   Charmaine Melia, MSN, APRN, FNP-BC, AGACNP-BC [facility]

## 2024-08-05 NOTE — Telephone Encounter (Signed)
 Reviewed celiac HLA DQ testing.  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   Risk of developing celiac from this gene is approximately 2.9% however given your mother's diagnosis as well, recommend continuing strict gluten-free diet and we will confirm no inflammation or blunting of the duodenal mucosa on upper endoscopy.

## 2024-08-06 NOTE — Telephone Encounter (Signed)
Spoke to pt, informed her of results and recommendations. Pt voiced understanding.  

## 2024-08-11 ENCOUNTER — Encounter (HOSPITAL_COMMUNITY): Payer: Self-pay

## 2024-08-11 ENCOUNTER — Encounter (HOSPITAL_COMMUNITY)
Admission: RE | Admit: 2024-08-11 | Discharge: 2024-08-11 | Disposition: A | Discharge status: Home / Self Care | Source: Ambulatory Visit | Attending: Internal Medicine | Admitting: Internal Medicine

## 2024-08-11 NOTE — Progress Notes (Signed)
 Attempted pre op call but patient is unavailable.  Left a message to call us  back.

## 2024-08-16 ENCOUNTER — Ambulatory Visit (HOSPITAL_COMMUNITY)
Admission: RE | Admit: 2024-08-16 | Discharge: 2024-08-16 | Disposition: A | Discharge status: Home / Self Care | Attending: Internal Medicine | Admitting: Internal Medicine

## 2024-08-16 ENCOUNTER — Encounter (HOSPITAL_COMMUNITY): Payer: Self-pay | Admitting: Internal Medicine

## 2024-08-16 ENCOUNTER — Encounter (HOSPITAL_COMMUNITY)
Admission: RE | Disposition: A | Discharge status: Home / Self Care | Payer: Self-pay | Source: Home / Self Care | Attending: Internal Medicine

## 2024-08-16 ENCOUNTER — Ambulatory Visit (HOSPITAL_COMMUNITY): Admitting: Certified Registered Nurse Anesthetist

## 2024-08-16 DIAGNOSIS — K581 Irritable bowel syndrome with constipation: Secondary | ICD-10-CM | POA: Insufficient documentation

## 2024-08-16 DIAGNOSIS — Z79899 Other long term (current) drug therapy: Secondary | ICD-10-CM | POA: Diagnosis not present

## 2024-08-16 DIAGNOSIS — K219 Gastro-esophageal reflux disease without esophagitis: Secondary | ICD-10-CM | POA: Insufficient documentation

## 2024-08-16 DIAGNOSIS — R12 Heartburn: Secondary | ICD-10-CM | POA: Diagnosis present

## 2024-08-16 DIAGNOSIS — Z800 Family history of malignant neoplasm of digestive organs: Secondary | ICD-10-CM

## 2024-08-16 DIAGNOSIS — K648 Other hemorrhoids: Secondary | ICD-10-CM | POA: Diagnosis not present

## 2024-08-16 DIAGNOSIS — K2970 Gastritis, unspecified, without bleeding: Secondary | ICD-10-CM

## 2024-08-16 DIAGNOSIS — I10 Essential (primary) hypertension: Secondary | ICD-10-CM | POA: Insufficient documentation

## 2024-08-16 DIAGNOSIS — Z1211 Encounter for screening for malignant neoplasm of colon: Secondary | ICD-10-CM

## 2024-08-16 DIAGNOSIS — Z8249 Family history of ischemic heart disease and other diseases of the circulatory system: Secondary | ICD-10-CM | POA: Insufficient documentation

## 2024-08-16 HISTORY — PX: COLONOSCOPY: SHX5424

## 2024-08-16 HISTORY — PX: ESOPHAGOGASTRODUODENOSCOPY: SHX5428

## 2024-08-16 LAB — POCT PREGNANCY, URINE: Preg Test, Ur: NEGATIVE

## 2024-08-16 SURGERY — COLONOSCOPY
Anesthesia: Monitor Anesthesia Care

## 2024-08-16 MED ORDER — PROPOFOL 500 MG/50ML IV EMUL
INTRAVENOUS | Status: DC | PRN
  Administered 2024-08-16: 200 ug/kg/min via INTRAVENOUS
  Administered 2024-08-16: 100 mg via INTRAVENOUS

## 2024-08-16 MED ORDER — LACTATED RINGERS IV SOLN: INTRAVENOUS | Status: DC

## 2024-08-16 MED ORDER — DEXMEDETOMIDINE HCL IN NACL 80 MCG/20ML IV SOLN
INTRAVENOUS | Status: DC | PRN
  Administered 2024-08-16 (×2): 8 ug via INTRAVENOUS

## 2024-08-16 MED ORDER — LIDOCAINE 2% (20 MG/ML) 5 ML SYRINGE
INTRAMUSCULAR | Status: DC | PRN
Start: 2024-08-16 — End: 2024-08-16
  Administered 2024-08-16: 60 mg via INTRAVENOUS

## 2024-08-16 NOTE — Anesthesia Postprocedure Evaluation (Signed)
 Anesthesia Post Note  Patient: Vanessa Stone  Procedure(s) Performed: COLONOSCOPY EGD (ESOPHAGOGASTRODUODENOSCOPY)  Patient location during evaluation: Phase II Anesthesia Type: MAC Level of consciousness: awake Pain management: pain level controlled Vital Signs Assessment: post-procedure vital signs reviewed and stable Respiratory status: spontaneous breathing and respiratory function stable Cardiovascular status: blood pressure returned to baseline and stable Postop Assessment: no headache and no apparent nausea or vomiting Anesthetic complications: no Comments: Late entry   No notable events documented.   Last Vitals:  Vitals:   08/16/24 0725 08/16/24 0858  BP: 109/84 (!) 88/57  Pulse:  93  Resp: 14 15  Temp: 36.8 C 36.6 C  SpO2: 99% 97%    Last Pain:  Vitals:   08/16/24 0858  TempSrc: Oral  PainSc: Asleep                 Yvonna JINNY Bosworth

## 2024-08-16 NOTE — Anesthesia Preprocedure Evaluation (Signed)
 Anesthesia Evaluation  Patient identified by MRN, date of birth, ID band Patient awake    Reviewed: Allergy & Precautions, H&P , NPO status , Patient's Chart, lab work & pertinent test results, reviewed documented beta blocker date and time   Airway Mallampati: II  TM Distance: >3 FB Neck ROM: full    Dental no notable dental hx.    Pulmonary neg pulmonary ROS   Pulmonary exam normal breath sounds clear to auscultation       Cardiovascular Exercise Tolerance: Good hypertension, negative cardio ROS  Rhythm:regular Rate:Normal     Neuro/Psych   Anxiety     negative neurological ROS  negative psych ROS   GI/Hepatic Neg liver ROS,GERD  ,,  Endo/Other  negative endocrine ROS    Renal/GU negative Renal ROS  negative genitourinary   Musculoskeletal   Abdominal   Peds  Hematology negative hematology ROS (+)   Anesthesia Other Findings   Reproductive/Obstetrics negative OB ROS                              Anesthesia Physical Anesthesia Plan  ASA: 2  Anesthesia Plan: MAC   Post-op Pain Management:    Induction:   PONV Risk Score and Plan: Propofol  infusion  Airway Management Planned:   Additional Equipment:   Intra-op Plan:   Post-operative Plan:   Informed Consent: I have reviewed the patients History and Physical, chart, labs and discussed the procedure including the risks, benefits and alternatives for the proposed anesthesia with the patient or authorized representative who has indicated his/her understanding and acceptance.     Dental Advisory Given  Plan Discussed with: CRNA  Anesthesia Plan Comments:         Anesthesia Quick Evaluation

## 2024-08-16 NOTE — Interval H&P Note (Signed)
 History and Physical Interval Note:  08/16/2024 7:59 AM  Vanessa Stone  has presented today for surgery, with the diagnosis of FHX: colon cancer/polyps,FHX:celiac,GERD.  The various methods of treatment have been discussed with the patient and family. After consideration of risks, benefits and other options for treatment, the patient has consented to  Procedure(s) with comments: COLONOSCOPY (N/A) - 8:30 am, asa 2 EGD (ESOPHAGOGASTRODUODENOSCOPY) (N/A) as a surgical intervention.  The patient's history has been reviewed, patient examined, no change in status, stable for surgery.  I have reviewed the patient's chart and labs.  Questions were answered to the patient's satisfaction.     Carlin MARLA Hasty

## 2024-08-16 NOTE — Transfer of Care (Signed)
 Immediate Anesthesia Transfer of Care Note  Patient: Vanessa Stone  Procedure(s) Performed: COLONOSCOPY EGD (ESOPHAGOGASTRODUODENOSCOPY)  Patient Location: Short Stay  Anesthesia Type:General  Level of Consciousness: drowsy  Airway & Oxygen Therapy: Patient Spontanous Breathing  Post-op Assessment: Report given to RN and Post -op Vital signs reviewed and stable  Post vital signs: Reviewed and stable  Last Vitals:  Vitals Value Taken Time  BP 88/57 08/16/24 08:58  Temp 36.6 C 08/16/24 08:58  Pulse 93 08/16/24 08:58  Resp 15 08/16/24 08:58  SpO2 97 % 08/16/24 08:58    Last Pain:  Vitals:   08/16/24 0858  TempSrc: Oral  PainSc: Asleep         Complications: No notable events documented.

## 2024-08-16 NOTE — Op Note (Signed)
 [facility] Patient Name: Vanessa Stone Procedure Date: 08/16/2024 8:29 AM MRN: [id number] Date of Birth: 03-Jun-1991 Attending MD: Carlin POUR. Cindie , [place], [id number] CSN: [id number] Age: 33 Admit Type: Outpatient Procedure:                Upper GI endoscopy Indications:              Heartburn Providers:                Carlin POUR. Cindie, DO, Devere Lodge, Chad Wilson,                            Technician Referring MD:              Medicines:                See the Anesthesia note for documentation of the                            administered medications Complications:            No immediate complications. Estimated Blood Loss:     Estimated blood loss was minimal. Procedure:                Pre-Anesthesia Assessment:                           - The anesthesia plan was to use monitored                            anesthesia care (MAC).                           After obtaining informed consent, the endoscope was                            passed under direct vision. Throughout the                            procedure, the patient's blood pressure, pulse, and                            oxygen saturations were monitored continuously. The                            [id number] ([id number]) Upper was introduced through                            the mouth, and advanced to the second part of                            duodenum. The upper GI endoscopy was accomplished                            without difficulty. The patient tolerated the                            procedure well. Scope In: 8:36:42 AM Scope Out:  8:39:56 AM Total Procedure Duration: 0 hours 3 minutes 14 seconds  Findings:      The Z-line was regular and was found 36 cm from the incisors.      Patchy mild inflammation was found in the gastric body. Biopsies were       taken with a cold forceps for Helicobacter pylori testing.      The duodenal bulb, first portion of the duodenum and second portion of       the duodenum  were normal. Biopsies for histology were taken with a cold       forceps for evaluation of celiac disease. Impression:               - Z-line regular, 36 cm from the incisors.                           - Gastritis. Biopsied.                           - Normal duodenal bulb, first portion of the                            duodenum and second portion of the duodenum.                            Biopsied. Moderate Sedation:      Per Anesthesia Care Recommendation:           - Patient has a contact number available for                            emergencies. The signs and symptoms of potential                            delayed complications were discussed with the                            patient. Return to normal activities tomorrow.                            Written discharge instructions were provided to the                            patient.                           - Resume previous diet.                           - Continue present medications.                           - Await pathology results.                           - Return to GI clinic in 3 months. Procedure Code(s):        --- Professional ---  56760, Esophagogastroduodenoscopy, flexible,                            transoral; with biopsy, single or multiple Diagnosis Code(s):        --- Professional ---                           K29.70, Gastritis, unspecified, without bleeding                           R12, Heartburn CPT copyright 2022 [employer] Association. All rights reserved. The codes documented in this report are preliminary and upon coder review may  be revised to meet current compliance requirements. Carlin POUR. Cindie, DO Carlin POUR. Vanessa Nowotny, DO 08/16/2024 8:42:13 AM This report has been signed electronically. Number of Addenda: 0

## 2024-08-16 NOTE — Discharge Instructions (Addendum)
 EGD Discharge instructions Please read the instructions outlined below and refer to this sheet in the next few weeks. These discharge instructions provide you with general information on caring for yourself after you leave the hospital. Your doctor may also give you specific instructions. While your treatment has been planned according to the most current medical practices available, unavoidable complications occasionally occur. If you have any problems or questions after discharge, please call your doctor. ACTIVITY You may resume your regular activity but move at a slower pace for the next 24 hours.  Take frequent rest periods for the next 24 hours.  Walking will help expel (get rid of) the air and reduce the bloated feeling in your abdomen.  No driving for 24 hours (because of the anesthesia (medicine) used during the test).  You may shower.  Do not sign any important legal documents or operate any machinery for 24 hours (because of the anesthesia used during the test).  NUTRITION Drink plenty of fluids.  You may resume your normal diet.  Begin with a light meal and progress to your normal diet.  Avoid alcoholic beverages for 24 hours or as instructed by your caregiver.  MEDICATIONS You may resume your normal medications unless your caregiver tells you otherwise.  WHAT YOU CAN EXPECT TODAY You may experience abdominal discomfort such as a feeling of fullness or "gas" pains.  FOLLOW-UP Your doctor will discuss the results of your test with you.  SEEK IMMEDIATE MEDICAL ATTENTION IF ANY OF THE FOLLOWING OCCUR: Excessive nausea (feeling sick to your stomach) and/or vomiting.  Severe abdominal pain and distention (swelling).  Trouble swallowing.  Temperature over 101 F (37.8 C).  Rectal bleeding or vomiting of blood.      Colonoscopy Discharge Instructions  Read the instructions outlined below and refer to this sheet in the next few weeks. These discharge instructions provide you  with general information on caring for yourself after you leave the hospital. Your doctor may also give you specific instructions. While your treatment has been planned according to the most current medical practices available, unavoidable complications occasionally occur.   ACTIVITY You may resume your regular activity, but move at a slower pace for the next 24 hours.  Take frequent rest periods for the next 24 hours.  Walking will help get rid of the air and reduce the bloated feeling in your belly (abdomen).  No driving for 24 hours (because of the medicine (anesthesia) used during the test).   Do not sign any important legal documents or operate any machinery for 24 hours (because of the anesthesia used during the test).  NUTRITION Drink plenty of fluids.  You may resume your normal diet as instructed by your doctor.  Begin with a light meal and progress to your normal diet. Heavy or fried foods are harder to digest and may make you feel sick to your stomach (nauseated).  Avoid alcoholic beverages for 24 hours or as instructed.  MEDICATIONS You may resume your normal medications unless your doctor tells you otherwise.  WHAT YOU CAN EXPECT TODAY Some feelings of bloating in the abdomen.  Passage of more gas than usual.  Spotting of blood in your stool or on the toilet paper.  IF YOU HAD POLYPS REMOVED DURING THE COLONOSCOPY: No aspirin products for 7 days or as instructed.  No alcohol for 7 days or as instructed.  Eat a soft diet for the next 24 hours.  FINDING OUT THE RESULTS OF YOUR TEST Not all test results  are available during your visit. If your test results are not back during the visit, make an appointment with your caregiver to find out the results. Do not assume everything is normal if you have not heard from your caregiver or the medical facility. It is important for you to follow up on all of your test results.  SEEK IMMEDIATE MEDICAL ATTENTION IF: You have more than a  spotting of blood in your stool.  Your belly is swollen (abdominal distention).  You are nauseated or vomiting.  You have a temperature over 101.  You have abdominal pain or discomfort that is severe or gets worse throughout the day.   Your EGD revealed mild amount inflammation in your stomach.  I took biopsies of this to rule out infection with a bacteria called H. pylori.  I also took samples of your small bowel to screen for celiac disease changes. Esophagus was normal. We will reach out with these results in 1-2 weeks.  Continue on famotidine daily.   Your colonoscopy was relatively unremarkable.  I did not find any polyps or evidence of colon cancer.  I recommend repeating colonoscopy in 5 years for colon cancer screening purposes, given your family history.    Overall, your colon appeared very healthy.  I did not see any active inflammation indicative of underlying inflammatory bowel disease such as Crohn's disease or ulcerative colitis throughout your colon or end portion of your small bowel.    Follow up in GI office in 3 months.  I hope you have a great rest of your week!  Carlin POUR. Cindie, D.O. Gastroenterology and Hepatology [facility]

## 2024-08-16 NOTE — Op Note (Signed)
 [facility] Patient Name: Vanessa Stone Procedure Date: 08/16/2024 8:24 AM MRN: [id number] Date of Birth: 1991-01-28 Attending MD: Carlin POUR. Cindie , [place], [id number] CSN: [id number] Age: 33 Admit Type: Outpatient Procedure:                Colonoscopy Indications:              Screening in patient at increased risk: Colorectal                            cancer in father in his 84s Providers:                Carlin POUR. Cindie, DO, Devere Lodge, Chad Wilson,                            Technician Referring MD:              Medicines:                See the Anesthesia note for documentation of the                            administered medications Complications:            No immediate complications. Estimated Blood Loss:     Estimated blood loss: none. Procedure:                Pre-Anesthesia Assessment:                           - The anesthesia plan was to use monitored                            anesthesia care (MAC).                           After obtaining informed consent, the colonoscope                            was passed under direct vision. Throughout the                            procedure, the patient's blood pressure, pulse, and                            oxygen saturations were monitored continuously. The                            [id number]-HQ190L ([id number]) Peds Colon was introduced                            through the anus and advanced to the the terminal                            ileum, with identification of the appendiceal                            orifice and IC valve. The colonoscopy  was performed                            without difficulty. The patient tolerated the                            procedure well. The quality of the bowel                            preparation was evaluated using the BBPS [facility]                            Bowel Preparation Scale) with scores of: Right                            Colon = 3, Transverse Colon = 3 and Left Colon = 3                             (entire mucosa seen well with no residual staining,                            small fragments of stool or opaque liquid). The                            total BBPS score equals 9. Scope In: 8:43:42 AM Scope Out: 8:54:47 AM Scope Withdrawal Time: 0 hours 7 minutes 23 seconds  Total Procedure Duration: 0 hours 11 minutes 5 seconds  Findings:      Non-bleeding internal hemorrhoids were found during retroflexion.      The terminal ileum appeared normal.      The entire examined colon appeared normal. Impression:               - Non-bleeding internal hemorrhoids.                           - The examined portion of the ileum was normal.                           - The entire examined colon is normal.                           - No specimens collected. Moderate Sedation:      Per Anesthesia Care Recommendation:           - Patient has a contact number available for                            emergencies. The signs and symptoms of potential                            delayed complications were discussed with the                            patient. Return to normal activities tomorrow.  Written discharge instructions were provided to the                            patient.                           - Resume previous diet.                           - Continue present medications.                           - Repeat colonoscopy in 5 years for screening                            purposes.                           - Return to GI clinic in 3 months. Procedure Code(s):        --- Professional ---                           H9894, Colorectal cancer screening; colonoscopy on                            individual at high risk Diagnosis Code(s):        --- Professional ---                           Z80.0, Family history of malignant neoplasm of                            digestive organs                           K64.8, Other hemorrhoids CPT  copyright 2022 [employer]. All rights reserved. The codes documented in this report are preliminary and upon coder review may  be revised to meet current compliance requirements. Carlin POUR. Cindie, DO Carlin POUR. Cindie, DO 08/16/2024 8:58:54 AM This report has been signed electronically. Number of Addenda: 0

## 2024-08-17 ENCOUNTER — Encounter (HOSPITAL_COMMUNITY): Payer: Self-pay | Admitting: Internal Medicine

## 2024-08-17 LAB — SURGICAL PATHOLOGY

## 2024-09-28 ENCOUNTER — Ambulatory Visit: Payer: Self-pay | Admitting: Internal Medicine

## 2024-10-28 ENCOUNTER — Encounter: Payer: Self-pay | Admitting: Gastroenterology

## 2024-10-28 ENCOUNTER — Ambulatory Visit: Admitting: Gastroenterology
